# Patient Record
Sex: Female | Born: 1995 | Race: White | Hispanic: No | Marital: Single | Smoking: Current every day smoker
Health system: Southern US, Community
[De-identification: ages and names within clinical notes are randomized; demographics above are authoritative.]

## PROBLEM LIST (undated history)

## (undated) DIAGNOSIS — G43909 Migraine, unspecified, not intractable, without status migrainosus: Secondary | ICD-10-CM

## (undated) DIAGNOSIS — F32A Depression, unspecified: Secondary | ICD-10-CM

## (undated) DIAGNOSIS — F419 Anxiety disorder, unspecified: Secondary | ICD-10-CM

## (undated) DIAGNOSIS — F319 Bipolar disorder, unspecified: Secondary | ICD-10-CM

## (undated) HISTORY — PX: WRIST SURGERY: SHX841

## (undated) HISTORY — PX: WISDOM TOOTH EXTRACTION: SHX21

---
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
## 2015-03-29 ENCOUNTER — Encounter (HOSPITAL_COMMUNITY): Payer: Self-pay | Admitting: Emergency Medicine

## 2015-03-29 ENCOUNTER — Emergency Department (HOSPITAL_COMMUNITY)
Admission: EM | Admit: 2015-03-29 | Discharge: 2015-03-30 | Disposition: A | Discharge status: Home / Self Care | Payer: 59 | Attending: Emergency Medicine | Admitting: Emergency Medicine

## 2015-03-29 DIAGNOSIS — Z720 Tobacco use: Secondary | ICD-10-CM | POA: Diagnosis not present

## 2015-03-29 DIAGNOSIS — Z3202 Encounter for pregnancy test, result negative: Secondary | ICD-10-CM | POA: Diagnosis not present

## 2015-03-29 DIAGNOSIS — R5383 Other fatigue: Secondary | ICD-10-CM | POA: Diagnosis not present

## 2015-03-29 DIAGNOSIS — F419 Anxiety disorder, unspecified: Secondary | ICD-10-CM | POA: Diagnosis not present

## 2015-03-29 DIAGNOSIS — E876 Hypokalemia: Secondary | ICD-10-CM | POA: Diagnosis not present

## 2015-03-29 DIAGNOSIS — R0602 Shortness of breath: Secondary | ICD-10-CM | POA: Diagnosis not present

## 2015-03-29 DIAGNOSIS — R002 Palpitations: Secondary | ICD-10-CM | POA: Diagnosis not present

## 2015-03-29 DIAGNOSIS — R531 Weakness: Secondary | ICD-10-CM | POA: Diagnosis not present

## 2015-03-29 DIAGNOSIS — M7981 Nontraumatic hematoma of soft tissue: Secondary | ICD-10-CM | POA: Diagnosis not present

## 2015-03-29 DIAGNOSIS — R42 Dizziness and giddiness: Secondary | ICD-10-CM | POA: Diagnosis present

## 2015-03-29 DIAGNOSIS — R062 Wheezing: Secondary | ICD-10-CM | POA: Insufficient documentation

## 2015-03-29 DIAGNOSIS — R05 Cough: Secondary | ICD-10-CM | POA: Diagnosis not present

## 2015-03-29 DIAGNOSIS — R000 Tachycardia, unspecified: Secondary | ICD-10-CM | POA: Insufficient documentation

## 2015-03-29 DIAGNOSIS — R251 Tremor, unspecified: Secondary | ICD-10-CM | POA: Insufficient documentation

## 2015-03-29 DIAGNOSIS — R059 Cough, unspecified: Secondary | ICD-10-CM

## 2015-03-29 LAB — POC URINE PREG, ED: PREG TEST UR: NEGATIVE

## 2015-03-29 MED ORDER — ALBUTEROL (5 MG/ML) CONTINUOUS INHALATION SOLN
10.0000 mg | INHALATION_SOLUTION | RESPIRATORY_TRACT | Status: DC
  Filled 2015-03-29: qty 20

## 2015-03-29 MED ORDER — IPRATROPIUM BROMIDE 0.02 % IN SOLN
0.5000 mg | Freq: Once | RESPIRATORY_TRACT | Status: AC
Start: 2015-03-30 — End: 2015-03-30
  Administered 2015-03-30: 0.5 mg via RESPIRATORY_TRACT
  Filled 2015-03-29: qty 2.5

## 2015-03-29 MED ORDER — LORAZEPAM 0.5 MG PO TABS
1.0000 mg | ORAL_TABLET | Freq: Once | ORAL | Status: DC
  Filled 2015-03-29: qty 2

## 2015-03-29 MED ORDER — PREDNISONE 20 MG PO TABS
40.0000 mg | ORAL_TABLET | Freq: Once | ORAL | Status: AC
  Administered 2015-03-30: 40 mg via ORAL
  Filled 2015-03-29: qty 2

## 2015-03-29 NOTE — ED Notes (Signed)
Pt. reports panic/anxiety attack  with hands shaking , palpitations and mild lightheaded onset today . Respirations unlabored / denies pain .

## 2015-03-29 NOTE — ED Provider Notes (Signed)
CSN: [id number]     Arrival date & time 03/29/15  2201 History  This chart was scribed for Amy Berry, PA-C, working with Eber Hong, MD by Chestine Spore, ED Scribe. The patient was seen in room TR11C/TR11C at 11:45 PM.    Chief Complaint  Patient presents with  . Panic Attack    The history is provided by the patient. No language interpreter was used.    HPI Comments: Amy Boyd is a 19 y.o. female who presents to the Emergency Department complaining of panic attack onset today. Pt notes that yesterday she went to [employer] with her job and she stopped riding the rides due to nausea and fatigue. Pt has been fatigued since yesterday and when she woke up she was lightheaded, "shakey", and worked up. Pt notes that she began to feel flustered and that is why she came into the ED. Pt notes that while working tonight she felt as if she would pass out due to the symptoms. Pt notes that she has had symptoms like this in the past but not as severe as today. She states that she is having associated symptoms of intermittent lightheadedness, SOB, chest tightness, palpitations, dry cough x 3 weeks, wheezing, weakness, and scattered brusing. Pt denies PMHx of cardiac issues. She denies appetite change, AMS, LOC. She also denies rhinorrhea, sore throat, nasal congestion, chest pain, lower extremity edema, chest tightness, abdominal pain, change in bowels, diarrhea, constipation, vomiting. She states that she is well-hydrated, yesterday she drank several bottles of water and even drinks Gatorade to try and replace her electrolytes. She denies any vaginal or urinary symptoms. She denies any numbness or tingling.  Pt reports that she is a cigarette smoker. Pt PCP is at [facility]. Pt denies having heavy menstural cycles  History reviewed. No pertinent past medical history. History reviewed. No pertinent past surgical history. No family history on file. Social History  Substance Use Topics  .  Smoking status: Current Some Day Smoker  . Smokeless tobacco: None  . Alcohol Use: Yes   OB History    No data available     Review of Systems  Constitutional: Positive for fatigue. Negative for fever, chills, diaphoresis, appetite change and unexpected weight change.  HENT: Negative.   Respiratory: Positive for cough, shortness of breath and wheezing.   Cardiovascular: Positive for palpitations.  Musculoskeletal: Negative.   Skin: Positive for color change. Negative for rash and wound.       Scattered bruising  Neurological: Positive for tremors, weakness and light-headedness. Negative for dizziness, seizures, syncope, facial asymmetry, speech difficulty, numbness and headaches.  Psychiatric/Behavioral: The patient is nervous/anxious.       Allergies  Review of patient's allergies indicates no known allergies.  Home Medications   Prior to Admission medications   Medication Sig Start Date End Date Taking? Authorizing Provider  albuterol (PROVENTIL HFA;VENTOLIN HFA) 108 (90 BASE) MCG/ACT inhaler Inhale 1-2 puffs into the lungs every 6 (six) hours as needed for wheezing or shortness of breath. 03/30/15   Amy Berry, PA-C  azithromycin (ZITHROMAX) 250 MG tablet Take 1 tablet (250 mg total) by mouth daily. Take first 2 tablets together, then 1 every day until finished. 03/30/15   Amy Berry, PA-C  potassium chloride SA (K-DUR,KLOR-CON) 20 MEQ tablet Take 1 tablet (20 mEq total) by mouth daily. 03/30/15   Amy Berry, PA-C  predniSONE (DELTASONE) 20 MG tablet Take 2 tablets (40 mg total) by mouth daily. Take 40 mg by mouth daily for  3 days, then  by mouth daily for 3 days, then  daily for 3 days 03/30/15   Amy Berry, PA-C   BP 125/71 mmHg  Pulse 87  Temp(Src) 98 F (36.7 C) (Oral)  Resp 19  SpO2 100%  LMP 03/08/2015 Physical Exam  Constitutional: She is oriented to person, place, and time. She appears well-developed and well-nourished. No distress.  HENT:  Head:  Normocephalic and atraumatic.  Nose: Nose normal.  Mouth/Throat: Oropharynx is clear and moist. No oropharyngeal exudate.  Eyes: Conjunctivae and EOM are normal. Pupils are equal, round, and reactive to light. Right eye exhibits no discharge. Left eye exhibits no discharge. No scleral icterus.  Neck: Normal range of motion. No JVD present. No tracheal deviation present. No thyromegaly present.  Cardiovascular: Regular rhythm, normal heart sounds and intact distal pulses.  Exam reveals no gallop and no friction rub.   No murmur heard. Tachycardic, symmetrical pulses palpated radial and 2+, dorsal pedis 2+, normal cap refill  Pulmonary/Chest: Effort normal. No respiratory distress. She has no wheezes. She has no rales. She exhibits no tenderness.  Breath sounds diminished bilaterally at the base, with rhonchi at the right lower lobe, no wheeze or rales  Abdominal: Soft. Bowel sounds are normal. She exhibits no distension and no mass. There is no tenderness. There is no rebound and no guarding.  Musculoskeletal: Normal range of motion. She exhibits no edema or tenderness.  Lymphadenopathy:    She has no cervical adenopathy.  Neurological: She is alert and oriented to person, place, and time. She has normal reflexes. No cranial nerve deficit. She exhibits normal muscle tone. Coordination normal.  Skin: Skin is warm and dry. No rash noted. She is not diaphoretic. No erythema. No pallor.  Psychiatric: She has a normal mood and affect. Her behavior is normal. Judgment and thought content normal.  Nursing note and vitals reviewed.   ED Course  Procedures (including critical care time) DIAGNOSTIC STUDIES: Oxygen Saturation is 100% on RA, nl by my interpretation.    COORDINATION OF CARE: 11:53 PM Discussed treatment plan with pt at bedside and pt agreed to plan.   Labs Review Labs Reviewed  BASIC METABOLIC PANEL - Abnormal; Notable for the following:    Potassium 3.1 (*)    Glucose, Bld 121  (*)    All other components within normal limits  CBG MONITORING, ED - Abnormal; Notable for the following:    Glucose-Capillary 111 (*)    All other components within normal limits  CBC WITH DIFFERENTIAL/PLATELET  URINALYSIS, ROUTINE W REFLEX MICROSCOPIC (NOT AT [facility])  URINE RAPID DRUG SCREEN, HOSP PERFORMED  POC URINE PREG, ED  Rosezena Sensor, ED    Imaging Review Dg Chest 2 View  03/30/2015   CLINICAL DATA:  Cough for 3 weeks.  Shortness of breath.  EXAM: CHEST  2 VIEW  COMPARISON:  April 14, 2011  FINDINGS: Lungs are clear. Heart size and pulmonary vascularity are normal. No adenopathy. No bone lesions.  IMPRESSION: No abnormality noted.   Electronically Signed   By: Bretta Bang III M.D.   On: 03/30/2015 00:56     EKG Interpretation   Date/Time:  Thursday March 30 2015 00:43:00 EDT Ventricular Rate:  103 PR Interval:  164 QRS Duration: 78 QT Interval:  332 QTC Calculation: 434 R Axis:   57 Text Interpretation:  Sinus tachycardia Nonspecific T wave abnormality  Abnormal ECG No acute changes No old tracing to compare Confirmed by  Rhunette Croft, MD, ANKIT [phone]) on  03/30/2015 1:11:37 AM      MDM   Final diagnoses:  Cough  Weakness  Shortness of breath  Hypokalemia   patient has complaints of fatigue, weakness, lightheaded, tremors, palpitations and shortness of breath with a cough for 3 weeks, and then also feels like she was having a panic attack.    On exam patient does not appear clinically dehydrated, she did have some tachycardia, and lungs had some rhonchi in the right lower lobe.  Given her many vague symptoms, I felt a workup was indicated.  Patient in the room did not appear acutely anxious. She was articulate and able to answer questions appropriately.    Plan:  Orthostatics, CBC, BMP, urine, EKG, chest x-ray and troponin, IVF, duoneb - would like to r/o dehydration, hypoglycemia, electrolyte abnormality, illicit drug use, anemia and also r/o PNA  Labs  are pertinent for hypokalemia, EKG shows sinus tachycardia with non-specific T-wave abnormalities, may be secondary to hypokalemia CXR negative for PNA, upon re-exam, lungs CTA after duoneb, suspect bronchitis with length of cough and pt current smoker Urine and remaining labs unremarkable, troponin negative.  Pt did feel better after tx and fluids.  Pt was discharged home after potassium was replaced, with Z-Pak, prednisone taper and albuterol inhaler as needed if she feels the cough is not improving. She was given 2 more doses of potassium, and we had a discussion with her and with father present about following up with a PCP within one to 2 weeks to redraw chemistry to reevaluate for hypokalemia.  All results were reviewed with the patient, all questions answered. Patient vitals were reviewed showing resolution of her tachycardia.  I explained to patient that we do not prescribe benzos from the ER for acute anxiety/panic attacks, and she stated that she did not feel like she needed a prescription for it.  She was discharged home in satisfactory condition.  Medications  ipratropium (ATROVENT) nebulizer solution 0.5 mg (0.5 mg Nebulization Given 03/30/15 0027)  predniSONE (DELTASONE) tablet 40 mg (40 mg Oral Given 03/30/15 0100)  albuterol (PROVENTIL) (2.5 MG/3ML) 0.083% nebulizer solution 5 mg (5 mg Nebulization Given 03/30/15 0027)  potassium chloride SA (K-DUR,KLOR-CON) CR tablet 40 mEq (40 mEq Oral Given 03/30/15 0159)   Filed Vitals:   03/29/15 2207 03/30/15 0029 03/30/15 0030 03/30/15 0203  BP: 138/88   125/71  Pulse: 112   87  Temp: 97.8 F (36.6 C)   98 F (36.7 C)  TempSrc: Oral   Oral  Resp: 18   19  SpO2: 100% 100% 100% 100%      I personally performed the services described in this documentation, which was scribed in my presence. The recorded information has been reviewed and is accurate.    Amy Berry, PA-C 03/30/15 0327  Eber Hong, MD 03/30/15 [phone]

## 2015-03-30 ENCOUNTER — Emergency Department (HOSPITAL_COMMUNITY): Payer: 59

## 2015-03-30 LAB — BASIC METABOLIC PANEL
Anion gap: 11 (ref 5–15)
BUN: 7 mg/dL (ref 6–20)
CO2: 24 mmol/L (ref 22–32)
Calcium: 9.3 mg/dL (ref 8.9–10.3)
Chloride: 106 mmol/L (ref 101–111)
Creatinine, Ser: 0.84 mg/dL (ref 0.44–1.00)
GFR calc Af Amer: >60 mL/min (ref >60)
GFR calc non Af Amer: >60 mL/min (ref >60)
GLUCOSE: 121 mg/dL — AB (ref 65–99)
POTASSIUM: 3.1 mmol/L — AB (ref 3.5–5.1)
SODIUM: 141 mmol/L (ref 135–145)

## 2015-03-30 LAB — CBC WITH DIFFERENTIAL/PLATELET
Basophils Absolute: 0 10*3/uL (ref 0.0–0.1)
Basophils Relative: 0 % (ref 0–1)
EOS PCT: 0 % (ref 0–5)
Eosinophils Absolute: 0 10*3/uL (ref 0.0–0.7)
HCT: 39 % (ref 36.0–46.0)
Hemoglobin: 13.5 g/dL (ref 12.0–15.0)
LYMPHS ABS: 2.6 10*3/uL (ref 0.7–4.0)
LYMPHS PCT: 34 % (ref 12–46)
MCH: 31.3 pg (ref 26.0–34.0)
MCHC: 34.6 g/dL (ref 30.0–36.0)
MCV: 90.3 fL (ref 78.0–100.0)
MONOS PCT: 5 % (ref 3–12)
Monocytes Absolute: 0.4 10*3/uL (ref 0.1–1.0)
NEUTROS PCT: 61 % (ref 43–77)
Neutro Abs: 4.8 10*3/uL (ref 1.7–7.7)
PLATELETS: 251 10*3/uL (ref 150–400)
RBC: 4.32 MIL/uL (ref 3.87–5.11)
RDW: 11.9 % (ref 11.5–15.5)
WBC: 7.9 10*3/uL (ref 4.0–10.5)

## 2015-03-30 LAB — RAPID URINE DRUG SCREEN, HOSP PERFORMED
Amphetamines: NOT DETECTED
BARBITURATES: NOT DETECTED
BENZODIAZEPINES: NOT DETECTED
Cocaine: NOT DETECTED
Opiates: NOT DETECTED
Tetrahydrocannabinol: NOT DETECTED

## 2015-03-30 LAB — URINALYSIS, ROUTINE W REFLEX MICROSCOPIC
BILIRUBIN URINE: NEGATIVE
GLUCOSE, UA: NEGATIVE mg/dL
HGB URINE DIPSTICK: NEGATIVE
Ketones, ur: NEGATIVE mg/dL
Leukocytes, UA: NEGATIVE
Nitrite: NEGATIVE
PH: 7.5 (ref 5.0–8.0)
Protein, ur: NEGATIVE mg/dL
SPECIFIC GRAVITY, URINE: 1.006 (ref 1.005–1.030)
Urobilinogen, UA: 0.2 mg/dL (ref 0.0–1.0)

## 2015-03-30 LAB — CBG MONITORING, ED: Glucose-Capillary: 111 mg/dL — ABNORMAL HIGH (ref 65–99)

## 2015-03-30 LAB — I-STAT TROPONIN, ED: Troponin i, poc: 0.01 ng/mL (ref 0.00–0.08)

## 2015-03-30 MED ORDER — POTASSIUM CHLORIDE CRYS ER 20 MEQ PO TBCR
40.0000 meq | EXTENDED_RELEASE_TABLET | Freq: Once | ORAL | Status: AC
  Administered 2015-03-30: 40 meq via ORAL
  Filled 2015-03-30: qty 2

## 2015-03-30 MED ORDER — ALBUTEROL SULFATE HFA 108 (90 BASE) MCG/ACT IN AERS: 1.0000 | INHALATION_SPRAY | Freq: Four times a day (QID) | RESPIRATORY_TRACT | Status: DC | PRN

## 2015-03-30 MED ORDER — POTASSIUM CHLORIDE CRYS ER 20 MEQ PO TBCR: 20.0000 meq | EXTENDED_RELEASE_TABLET | Freq: Every day | ORAL | Status: DC

## 2015-03-30 MED ORDER — AZITHROMYCIN 250 MG PO TABS: 250.0000 mg | ORAL_TABLET | Freq: Every day | ORAL | Status: DC

## 2015-03-30 MED ORDER — PREDNISONE 20 MG PO TABS: 40.0000 mg | ORAL_TABLET | Freq: Every day | ORAL | Status: DC

## 2015-03-30 MED ORDER — ALBUTEROL SULFATE (2.5 MG/3ML) 0.083% IN NEBU
5.0000 mg | INHALATION_SOLUTION | Freq: Once | RESPIRATORY_TRACT | Status: AC
Start: 2015-03-30 — End: 2015-03-30
  Administered 2015-03-30: 5 mg via RESPIRATORY_TRACT
  Filled 2015-03-30: qty 6

## 2015-03-30 NOTE — ED Notes (Signed)
Lab called about UA and UDS 

## 2015-03-30 NOTE — ED Notes (Signed)
Patient transported to X-ray 

## 2015-03-30 NOTE — Discharge Instructions (Signed)
Cough, Adult  A cough is a reflex that helps clear your throat and airways. It can help heal the body or may be a reaction to an irritated airway. A cough may only last 2 or 3 weeks (acute) or may last more than 8 weeks (chronic).  CAUSES Acute cough:  Viral or bacterial infections. Chronic cough:  Infections.  Allergies.  Asthma.  Post-nasal drip.  Smoking.  Heartburn or acid reflux.  Some medicines.  Chronic lung problems (COPD).  Cancer. SYMPTOMS   Cough.  Fever.  Chest pain.  Increased breathing rate.  High-pitched whistling sound when breathing (wheezing).  Colored mucus that you cough up (sputum). TREATMENT   A bacterial cough may be treated with antibiotic medicine.  A viral cough must run its course and will not respond to antibiotics.  Your caregiver may recommend other treatments if you have a chronic cough. HOME CARE INSTRUCTIONS   Only take over-the-counter or prescription medicines for pain, discomfort, or fever as directed by your caregiver. Use cough suppressants only as directed by your caregiver.  Use a cold steam vaporizer or humidifier in your bedroom or home to help loosen secretions.  Sleep in a semi-upright position if your cough is worse at night.  Rest as needed.  Stop smoking if you smoke. SEEK IMMEDIATE MEDICAL CARE IF:   You have pus in your sputum.  Your cough starts to worsen.  You cannot control your cough with suppressants and are losing sleep.  You begin coughing up blood.  You have difficulty breathing.  You develop pain which is getting worse or is uncontrolled with medicine.  You have a fever. MAKE SURE YOU:   Understand these instructions.  Will watch your condition.  Will get help right away if you are not doing well or get worse. Document Released: 02/01/2011 Document Revised: 10/28/2011 Document Reviewed: 02/01/2011 [facility] Patient Information 2015 [place], [place]. This information is not intended  to replace advice given to you by your health care provider. Make sure you discuss any questions you have with your health care provider.  Weakness Weakness is a lack of strength. It may be felt all over the body (generalized) or in one specific part of the body (focal). Some causes of weakness can be serious. You may need further medical evaluation, especially if you are elderly or you have a history of immunosuppression (such as chemotherapy or HIV), kidney disease, heart disease, or diabetes. CAUSES  Weakness can be caused by many different things, including:  Infection.  Physical exhaustion.  Internal bleeding or other blood loss that results in a lack of red blood cells (anemia).  Dehydration. This cause is more common in elderly people.  Side effects or electrolyte abnormalities from medicines, such as pain medicines or sedatives.  Emotional distress, anxiety, or depression.  Circulation problems, especially severe peripheral arterial disease.  Heart disease, such as rapid atrial fibrillation, bradycardia, or heart failure.  Nervous system disorders, such as Guillain-Barr syndrome, multiple sclerosis, or stroke. DIAGNOSIS  To find the cause of your weakness, your caregiver will take your history and perform a physical exam. Lab tests or X-rays may also be ordered, if needed. TREATMENT  Treatment of weakness depends on the cause of your symptoms and can vary greatly. HOME CARE INSTRUCTIONS   Rest as needed.  Eat a well-balanced diet.  Try to get some exercise every day.  Only take over-the-counter or prescription medicines as directed by your caregiver. SEEK MEDICAL CARE IF:   Your weakness seems  to be getting worse or spreads to other parts of your body.  You develop new aches or pains. SEEK IMMEDIATE MEDICAL CARE IF:   You cannot perform your normal daily activities, such as getting dressed and feeding yourself.  You cannot walk up and down stairs, or you feel  exhausted when you do so.  You have shortness of breath or chest pain.  You have difficulty moving parts of your body.  You have weakness in only one area of the body or on only one side of the body.  You have a fever.  You have trouble speaking or swallowing.  You cannot control your bladder or bowel movements.  You have black or bloody vomit or stools. MAKE SURE YOU:  Understand these instructions.  Will watch your condition.  Will get help right away if you are not doing well or get worse. Document Released: 08/05/2005 Document Revised: 02/04/2012 Document Reviewed: 10/04/2011 [facility] Patient Information 2015 [place], [place]. This information is not intended to replace advice given to you by your health care provider. Make sure you discuss any questions you have with your health care provider.  Weakness Weakness is a lack of strength. You may feel weak all over your body or just in one part of your body. Weakness can be serious. In some cases, you may need more medical tests. HOME CARE  Rest.  Eat a well-balanced diet.  Try to exercise every day.  Only take medicines as told by your doctor. GET HELP RIGHT AWAY IF:   You cannot do your normal daily activities.  You cannot walk up and down stairs, or you feel very tired when you do so.  You have shortness of breath or chest pain.  You have trouble moving parts of your body.  You have weakness in only one body part or on only one side of the body.  You have a fever.  You have trouble speaking or swallowing.  You cannot control when you pee (urinate) or poop (bowel movement).  You have black or bloody throw up (vomit) or poop.  Your weakness gets worse or spreads to other body parts.  You have new aches or pains. MAKE SURE YOU:   Understand these instructions.  Will watch your condition.  Will get help right away if you are not doing well or get worse. Document Released: 07/18/2008 Document Revised:  02/04/2012 Document Reviewed: 10/04/2011 [facility] Patient Information 2015 [place], [place]. This information is not intended to replace advice given to you by your health care provider. Make sure you discuss any questions you have with your health care provider.

## 2015-03-30 NOTE — ED Notes (Signed)
Lab called about UA and UDS

## 2018-01-11 ENCOUNTER — Encounter (HOSPITAL_COMMUNITY): Payer: Self-pay | Admitting: Emergency Medicine

## 2018-01-11 ENCOUNTER — Other Ambulatory Visit: Payer: Self-pay

## 2018-01-11 ENCOUNTER — Emergency Department (HOSPITAL_COMMUNITY): Payer: 59

## 2018-01-11 ENCOUNTER — Observation Stay (HOSPITAL_COMMUNITY)
Admission: EM | Admit: 2018-01-11 | Discharge: 2018-01-12 | Disposition: A | Discharge status: Home / Self Care | Payer: 59 | Attending: Internal Medicine | Admitting: Internal Medicine

## 2018-01-11 DIAGNOSIS — Z79899 Other long term (current) drug therapy: Secondary | ICD-10-CM | POA: Insufficient documentation

## 2018-01-11 DIAGNOSIS — R4182 Altered mental status, unspecified: Secondary | ICD-10-CM | POA: Diagnosis not present

## 2018-01-11 DIAGNOSIS — Z8669 Personal history of other diseases of the nervous system and sense organs: Secondary | ICD-10-CM | POA: Diagnosis not present

## 2018-01-11 DIAGNOSIS — R4701 Aphasia: Secondary | ICD-10-CM | POA: Diagnosis present

## 2018-01-11 DIAGNOSIS — R51 Headache: Secondary | ICD-10-CM | POA: Diagnosis not present

## 2018-01-11 DIAGNOSIS — F17200 Nicotine dependence, unspecified, uncomplicated: Secondary | ICD-10-CM | POA: Insufficient documentation

## 2018-01-11 DIAGNOSIS — G43109 Migraine with aura, not intractable, without status migrainosus: Secondary | ICD-10-CM

## 2018-01-11 DIAGNOSIS — R479 Unspecified speech disturbances: Secondary | ICD-10-CM

## 2018-01-11 DIAGNOSIS — G43719 Chronic migraine without aura, intractable, without status migrainosus: Secondary | ICD-10-CM

## 2018-01-11 HISTORY — DX: Migraine, unspecified, not intractable, without status migrainosus: G43.909

## 2018-01-11 LAB — COMPREHENSIVE METABOLIC PANEL
ALT: 20 U/L (ref 14–54)
ANION GAP: 10 (ref 5–15)
AST: 22 U/L (ref 15–41)
Albumin: 4.9 g/dL (ref 3.5–5.0)
Alkaline Phosphatase: 76 U/L (ref 38–126)
BILIRUBIN TOTAL: 0.6 mg/dL (ref 0.3–1.2)
BUN: 17 mg/dL (ref 6–20)
CALCIUM: 9.7 mg/dL (ref 8.9–10.3)
CO2: 26 mmol/L (ref 22–32)
CREATININE: 0.84 mg/dL (ref 0.44–1.00)
Chloride: 105 mmol/L (ref 101–111)
GFR calc Af Amer: >60 mL/min (ref >60)
Glucose, Bld: 151 mg/dL — ABNORMAL HIGH (ref 65–99)
POTASSIUM: 3.8 mmol/L (ref 3.5–5.1)
Sodium: 141 mmol/L (ref 135–145)
TOTAL PROTEIN: 8 g/dL (ref 6.5–8.1)

## 2018-01-11 LAB — CBC
HCT: 39.5 % (ref 36.0–46.0)
Hemoglobin: 13.5 g/dL (ref 12.0–15.0)
MCH: 30.8 pg (ref 26.0–34.0)
MCHC: 34.2 g/dL (ref 30.0–36.0)
MCV: 90.2 fL (ref 78.0–100.0)
PLATELETS: 282 10*3/uL (ref 150–400)
RBC: 4.38 MIL/uL (ref 3.87–5.11)
RDW: 11.7 % (ref 11.5–15.5)
WBC: 7.8 10*3/uL (ref 4.0–10.5)

## 2018-01-11 LAB — PROTIME-INR
INR: 0.99
PROTHROMBIN TIME: 13 s (ref 11.4–15.2)

## 2018-01-11 LAB — I-STAT CHEM 8, ED
BUN: 15 mg/dL (ref 6–20)
Calcium, Ion: 1.26 mmol/L (ref 1.15–1.40)
Chloride: 103 mmol/L (ref 101–111)
Creatinine, Ser: 0.8 mg/dL (ref 0.44–1.00)
Glucose, Bld: 147 mg/dL — ABNORMAL HIGH (ref 65–99)
HEMATOCRIT: 40 % (ref 36.0–46.0)
HEMOGLOBIN: 13.6 g/dL (ref 12.0–15.0)
POTASSIUM: 3.8 mmol/L (ref 3.5–5.1)
Sodium: 141 mmol/L (ref 135–145)
TCO2: 27 mmol/L (ref 22–32)

## 2018-01-11 LAB — APTT: APTT: 31 s (ref 24–36)

## 2018-01-11 LAB — CBG MONITORING, ED: Glucose-Capillary: 145 mg/dL — ABNORMAL HIGH (ref 65–99)

## 2018-01-11 MED ORDER — METOCLOPRAMIDE HCL 5 MG/ML IJ SOLN
10.0000 mg | Freq: Once | INTRAMUSCULAR | Status: AC
  Administered 2018-01-11: 10 mg via INTRAVENOUS
  Filled 2018-01-11: qty 2

## 2018-01-11 MED ORDER — LORAZEPAM 2 MG/ML IJ SOLN
1.0000 mg | Freq: Once | INTRAMUSCULAR | Status: AC
  Administered 2018-01-11: 1 mg via INTRAVENOUS
  Filled 2018-01-11: qty 1

## 2018-01-11 MED ORDER — STROKE: EARLY STAGES OF RECOVERY BOOK
Freq: Once | Status: DC
  Filled 2018-01-11: qty 1

## 2018-01-11 MED ORDER — LORAZEPAM 2 MG/ML IJ SOLN
0.5000 mg | Freq: Once | INTRAMUSCULAR | Status: AC | PRN
  Administered 2018-01-12: 0.5 mg via INTRAVENOUS
  Filled 2018-01-11: qty 1

## 2018-01-11 MED ORDER — ACETAMINOPHEN 160 MG/5ML PO SOLN: 650.0000 mg | ORAL | Status: DC | PRN

## 2018-01-11 MED ORDER — ACETAMINOPHEN 325 MG PO TABS: 650.0000 mg | ORAL_TABLET | ORAL | Status: DC | PRN

## 2018-01-11 MED ORDER — KETOROLAC TROMETHAMINE 30 MG/ML IJ SOLN
30.0000 mg | Freq: Once | INTRAMUSCULAR | Status: AC
  Administered 2018-01-11: 30 mg via INTRAVENOUS
  Filled 2018-01-11: qty 1

## 2018-01-11 MED ORDER — ASPIRIN 81 MG PO CHEW
324.0000 mg | CHEWABLE_TABLET | Freq: Once | ORAL | Status: AC
  Administered 2018-01-11: 324 mg via ORAL
  Filled 2018-01-11: qty 4

## 2018-01-11 MED ORDER — IOPAMIDOL (ISOVUE-370) INJECTION 76%
INTRAVENOUS | Status: AC
  Filled 2018-01-11: qty 100

## 2018-01-11 MED ORDER — ACETAMINOPHEN 650 MG RE SUPP: 650.0000 mg | RECTAL | Status: DC | PRN

## 2018-01-11 MED ORDER — ENOXAPARIN SODIUM 40 MG/0.4ML ~~LOC~~ SOLN
40.0000 mg | Freq: Every day | SUBCUTANEOUS | Status: DC
  Filled 2018-01-11: qty 0.4

## 2018-01-11 MED ORDER — IOPAMIDOL (ISOVUE-370) INJECTION 76%
100.0000 mL | Freq: Once | INTRAVENOUS | Status: AC | PRN
  Administered 2018-01-11: 80 mL via INTRAVENOUS

## 2018-01-11 NOTE — H&P (Addendum)
History and Physical    Amy Boyd ZOX:[id number] DOB: April 12, 1996 DOA: 01/11/2018  Referring MD/NP/PA: Alona Bene, MD PCP: Deatra James, MD  Patient coming from: home  Chief Complaint: Could not get my words out   I have personally briefly reviewed patient's old medical records in [facility]   HPI: Amy Boyd is a 22 y.o. female with medical history significant of migraine headaches; who presents with complaints of inability to get her words out.  Symptoms started around 1330 this afternoon with her stuttering and not being able to say what she wanted to.  She was able to understand what was being said to her, but felt like she was in a foggy at the time.  Symptoms only lasted approximately 1 minute and then self resolved.  Associated symptoms of blurred vision and palpitations.  Symptoms reoccurred at around 1850 similarly lasting approximately 1 minute.  She reports having a history of frequent headaches 3-4 times a week and migraines every 1 to 2 weeks.  She utilizes Excedrin migraine for symptoms as needed.  Denies headache/migraine symptoms surrounding episodes, focal weakness, chest pain, drug or alcohol use, family history of clotting disorder/bleeding issue or thyroid issue.  She reports recently having her thyroid levels checked and were reportedly normal.  She does admit to vaping with Juul.  ED Course: Upon admission into the emergency department patient was noted to be afebrile, pulse 93-110, respirations 16-21, blood pressures 123/79, 155/117, and O2 saturation maintained on room air.  CT scan of the brain showed no acute abnormalities.  labs including CBC and CMP were unremarkable for any significant acute abnormalities. Telemetry neurology was consulted and recommended CT angiogram of the head and neck, MRI, and completion of stroke work-up with possible hypercoagulable studies.  [facility] called to admit.  Review of Systems  Constitutional: Negative for chills and fever.    HENT: Negative for ear discharge and ear pain.   Eyes: Positive for blurred vision. Negative for pain.  Respiratory: Negative for cough and shortness of breath.   Cardiovascular: Positive for palpitations. Negative for chest pain and leg swelling.  Gastrointestinal: Negative for abdominal pain, constipation, diarrhea, nausea and vomiting.  Genitourinary: Negative for dysuria and frequency.  Musculoskeletal: Negative for joint pain and myalgias.  Skin: Negative for itching and rash.  Neurological: Positive for speech change. Negative for weakness.  Endo/Heme/Allergies: Does not bruise/bleed easily.  Psychiatric/Behavioral: Negative for memory loss and suicidal ideas.    Past Medical History:  Diagnosis Date  . Migraine headache     Past Surgical History:  Procedure Laterality Date  . WISDOM TOOTH EXTRACTION    . WRIST SURGERY Left      reports that she has been smoking.  She has never used smokeless tobacco. She reports that she drinks alcohol. She reports that she does not use drugs.  No Known Allergies  No known family history of blood clotting disorder  Prior to Admission medications   Medication Sig Start Date End Date Taking? Authorizing Provider  albuterol (PROVENTIL HFA;VENTOLIN HFA) 108 (90 BASE) MCG/ACT inhaler Inhale 1-2 puffs into the lungs every 6 (six) hours as needed for wheezing or shortness of breath. 03/30/15   Danelle Berry, PA-C  azithromycin (ZITHROMAX) 250 MG tablet Take 1 tablet (250 mg total) by mouth daily. Take first 2 tablets together, then 1 every day until finished. 03/30/15   Danelle Berry, PA-C  LO LOESTRIN FE 1 MG-10 MCG / 10 MCG tablet Take 1 tablet by mouth daily. 11/19/17  [provider]  potassium chloride SA (K-DUR,KLOR-CON) 20 MEQ tablet Take 1 tablet (20 mEq total) by mouth daily. 03/30/15   Danelle Berry, PA-C  predniSONE (DELTASONE) 20 MG tablet Take 2 tablets (40 mg total) by mouth daily. Take 40 mg by mouth daily for 3 days, then   by mouth daily for 3 days, then  daily for 3 days 03/30/15   Danelle Berry, PA-C    Physical Exam:  Constitutional: Young female NAD, calm, comfortable Vitals:   01/11/18 1940 01/11/18 1942 01/11/18 2000 01/11/18 2030  BP: (!) 139/102  131/85 123/79  Pulse: (!) 104  (!) 103 93  Resp: 20  (!) 21 16  Temp: 98.2 F (36.8 C)     TempSrc: Oral     SpO2: 100%  99% 98%  Weight:  65.3 kg (144 lb)    Height:   (1.626 m)     Eyes: PERRL, lids and conjunctivae normal ENMT: Mucous membranes are moist. Posterior pharynx clear of any exudate or lesions.Normal dentition.  Neck: normal, supple, no masses, no thyromegaly Respiratory: clear to auscultation bilaterally, no wheezing, no crackles. Normal respiratory effort. No accessory muscle use.  Cardiovascular: Regular rate and rhythm, no murmurs / rubs / gallops. No extremity edema. 2+ pedal pulses. No carotid bruits.  Abdomen: no tenderness, no masses palpated. No hepatosplenomegaly. Bowel sounds positive.  Musculoskeletal: no clubbing / cyanosis. No joint deformity upper and lower extremities. Good ROM, no contractures. Normal muscle tone.  Skin: no rashes, lesions, ulcers. No induration Neurologic: CN 2-12 grossly intact. Sensation intact, DTR normal. Strength 5/5 in all 4.  Psychiatric: Normal judgment and insight. Alert and oriented x 3. Normal mood.     Labs on Admission: I have personally reviewed following labs and imaging studies  CBC: Recent Labs  Lab 01/11/18 1946 01/11/18 2017  WBC 7.8  --   HGB 13.5 13.6  HCT 39.5 40.0  MCV 90.2  --   PLT 282  --    Basic Metabolic Panel: Recent Labs  Lab 01/11/18 1946 01/11/18 2017  NA 141 141  K 3.8 3.8  CL 105 103  CO2 26  --   GLUCOSE 151* 147*  BUN 17 15  CREATININE 0.84 0.80  CALCIUM 9.7  --    GFR: Estimated Creatinine Clearance: 95.3 mL/min (by C-G formula based on SCr of 0.8 mg/dL). Liver Function Tests: Recent Labs  Lab 01/11/18 1946  AST 22  ALT 20    ALKPHOS 76  BILITOT 0.6  PROT 8.0  ALBUMIN 4.9   No results for input(s): LIPASE, AMYLASE in the last 168 hours. No results for input(s): AMMONIA in the last 168 hours. Coagulation Profile: No results for input(s): INR, PROTIME in the last 168 hours. Cardiac Enzymes: No results for input(s): CKTOTAL, CKMB, CKMBINDEX, TROPONINI in the last 168 hours. BNP (last 3 results) No results for input(s): PROBNP in the last 8760 hours. HbA1C: No results for input(s): HGBA1C in the last 72 hours. CBG: Recent Labs  Lab 01/11/18 1940  GLUCAP 145*   Lipid Profile: No results for input(s): CHOL, HDL, LDLCALC, TRIG, CHOLHDL, LDLDIRECT in the last 72 hours. Thyroid Function Tests: No results for input(s): TSH, T4TOTAL, FREET4, T3FREE, THYROIDAB in the last 72 hours. Anemia Panel: No results for input(s): VITAMINB12, FOLATE, FERRITIN, TIBC, IRON, RETICCTPCT in the last 72 hours. Urine analysis:    Component Value Date/Time   COLORURINE YELLOW 03/29/2015 2314   APPEARANCEUR CLEAR 03/29/2015 2314   LABSPEC 1.006 03/29/2015  2314   PHURINE 7.5 03/29/2015 2314   GLUCOSEU NEGATIVE 03/29/2015 2314   HGBUR NEGATIVE 03/29/2015 2314   BILIRUBINUR NEGATIVE 03/29/2015 2314   KETONESUR NEGATIVE 03/29/2015 2314   PROTEINUR NEGATIVE 03/29/2015 2314   UROBILINOGEN 0.2 03/29/2015 2314   NITRITE NEGATIVE 03/29/2015 2314   LEUKOCYTESUR NEGATIVE 03/29/2015 2314   Sepsis Labs: No results found for this or any previous visit (from the past 240 hour(s)).   Radiological Exams on Admission: Ct Head Wo Contrast  Result Date: 01/11/2018 CLINICAL DATA:  Waxing and waning altered mental status, with intermittent aphasia. EXAM: CT HEAD WITHOUT CONTRAST TECHNIQUE: Contiguous axial images were obtained from the base of the skull through the vertex without intravenous contrast. COMPARISON:  None. FINDINGS: Brain: No evidence of acute infarction, hemorrhage, hydrocephalus, extra-axial collection or mass lesion/mass  effect. Normal cerebral volume. No white matter disease. Vascular: No hyperdense vessel or unexpected calcification. Skull: Normal. Negative for fracture or focal lesion. Sinuses/Orbits: No acute finding. Other: None. IMPRESSION: Negative exam. Electronically Signed   By: Elsie Stain M.D.   On: 01/11/2018 20:21    EKG: Independently reviewed. Normal sinus rhythm at 91 bpm  Assessment/Plan Expressive aphasia: Acute.  Patient presents with difficulty expressing  words, but able to understand.  Initial CT scan of the brain negative for any acute abnormalities.  Telemetry neurology consulted recommending admission for work-up.  Risk factors include oral contraceptive pills and Juul vaping.  Question TIA/stroke vs. complex migraine vs.  - To a telemetry bed at [facility] - Admit to telemetry bed - Stroke order set initiated - Neuro checks  - Add on PT/INR and APTT - Follow-up alcohol level and UDS - Check  MRI/MRA head w/o contrast  - Speech to eval and treat - Check echocardiogram - Check Hemoglobin A1c and lipid panel in a.m. - ASA - May warrant warrant formal hypercoagulable studies. - Appreciate neurology consultative services, will follow-up for further recommends  Vaping: Patient reports vaping with Juul. - Counseled on the need of cessation  Oral contraceptive use - Held oral contraceptive pill  DVT prophylaxis: lovenox Code Status: Full Family Communication: Discussed plan of care with the patient and family present at bedside Disposition Plan: TBD  Consults called: Neurology  Admission status: observation  Clydie Braun MD [facility] Pager [phone]   If 7PM-7AM, please contact night-coverage [email or web address] Password [facility]  01/11/2018, 10:05 PM
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
## 2018-01-11 NOTE — ED Triage Notes (Signed)
Pt presented to the ED with complaints of waxing and waning AMS with intermittent aphasia.  Patient was driving at 1610 today when she began to stutter and "I couldn't get my words out." Patient states she was "foggy" and had to have her boyfriend pull over. Patient States the episode lasted a minute and then went away. At about 1850, patient states she was driving and she had another episode similar to previous which lasted 1 minute also. Patient hx of migraines. No hx of seizures or other neuro disorders.

## 2018-01-12 ENCOUNTER — Observation Stay (HOSPITAL_BASED_OUTPATIENT_CLINIC_OR_DEPARTMENT_OTHER): Payer: 59

## 2018-01-12 ENCOUNTER — Observation Stay (HOSPITAL_COMMUNITY): Payer: 59

## 2018-01-12 ENCOUNTER — Encounter (HOSPITAL_COMMUNITY): Payer: Self-pay | Admitting: Internal Medicine

## 2018-01-12 DIAGNOSIS — R4701 Aphasia: Secondary | ICD-10-CM | POA: Diagnosis not present

## 2018-01-12 DIAGNOSIS — G459 Transient cerebral ischemic attack, unspecified: Secondary | ICD-10-CM

## 2018-01-12 DIAGNOSIS — G43109 Migraine with aura, not intractable, without status migrainosus: Secondary | ICD-10-CM | POA: Diagnosis not present

## 2018-01-12 DIAGNOSIS — F17200 Nicotine dependence, unspecified, uncomplicated: Secondary | ICD-10-CM | POA: Diagnosis not present

## 2018-01-12 DIAGNOSIS — Z8669 Personal history of other diseases of the nervous system and sense organs: Secondary | ICD-10-CM | POA: Diagnosis not present

## 2018-01-12 DIAGNOSIS — R4182 Altered mental status, unspecified: Secondary | ICD-10-CM | POA: Diagnosis present

## 2018-01-12 DIAGNOSIS — R51 Headache: Secondary | ICD-10-CM | POA: Diagnosis not present

## 2018-01-12 DIAGNOSIS — Z79899 Other long term (current) drug therapy: Secondary | ICD-10-CM | POA: Diagnosis not present

## 2018-01-12 LAB — ECHOCARDIOGRAM COMPLETE
HEIGHTINCHES: 64 in
Weight: 2304 oz

## 2018-01-12 LAB — RAPID URINE DRUG SCREEN, HOSP PERFORMED
Amphetamines: NOT DETECTED
Barbiturates: NOT DETECTED
Benzodiazepines: POSITIVE — AB
Cocaine: POSITIVE — AB
Opiates: NOT DETECTED
Tetrahydrocannabinol: NOT DETECTED

## 2018-01-12 LAB — TSH: TSH: 1.849 u[IU]/mL (ref 0.350–4.500)

## 2018-01-12 MED ORDER — SUMATRIPTAN SUCCINATE 25 MG PO TABS: 25.0000 mg | ORAL_TABLET | ORAL | 0 refills | Status: DC | PRN

## 2018-01-12 MED ORDER — ENOXAPARIN SODIUM 40 MG/0.4ML ~~LOC~~ SOLN
40.0000 mg | Freq: Every day | SUBCUTANEOUS | Status: DC
  Administered 2018-01-12: 40 mg via SUBCUTANEOUS
  Filled 2018-01-12: qty 0.4

## 2018-01-12 MED ORDER — SUMATRIPTAN SUCCINATE 25 MG PO TABS
25.0000 mg | ORAL_TABLET | ORAL | Status: DC | PRN
  Filled 2018-01-12: qty 1

## 2018-01-12 MED ORDER — GADOBENATE DIMEGLUMINE 529 MG/ML IV SOLN
15.0000 mL | Freq: Once | INTRAVENOUS | Status: AC
  Administered 2018-01-12: 13 mL via INTRAVENOUS

## 2018-01-12 NOTE — Progress Notes (Signed)
Pt VSS; telemetry applied and verified with CCMD; NT called to second verify. Dionne Bucy RN

## 2018-01-12 NOTE — Progress Notes (Signed)
Patient and family educated on discharge summary. Able to verbalized understanding and teach back. IV and telemetry removed. No new concerns. Discharge summary provide.  Discharged from unit per staff in wheelchair. Family to transport home

## 2018-01-12 NOTE — Progress Notes (Signed)
Pt arrived to the unit via [facility]; pt A&O x4; MAE x4; denies any pain, pt oriented to the unit and room; fall safety precaution and prevention education completed. Pt skin clean, dry and intact with no pressure ulcer or opened wound noted. Pt sleeping comfortably in bed with call light within reach and bed alarm on. Boyfriend remains at bedside. Dionne Bucy RN

## 2018-01-12 NOTE — Progress Notes (Signed)
VASCULAR LAB   Patient had normal MRA of neck.  Please advise if Carotid Duplex still needed.    Thank you,  Lateka Rady, RVT 01/12/2018, 1:43 PM

## 2018-01-12 NOTE — Evaluation (Signed)
SLP Cancellation Note  Patient Details Name: Amy Boyd MRN: [id number] DOB: 07-14-1996   Cancelled treatment:       Reason Eval/Treat Not Completed: Other (comment)(pt reports symptoms have resolved, CT negative, MRI pending - will conduct eval if MRI + for acute change - pt/mother agree)   Chales Abrahams 01/12/2018, 9:34 AM   Donavan Burnet, MS [facility] SLP [phone]

## 2018-01-12 NOTE — Progress Notes (Signed)
Bedside EEG completed, results pending. 

## 2018-01-12 NOTE — Discharge Instructions (Signed)
Migraine Headache A migraine headache is an intense, throbbing pain on one side or both sides of the head. Migraines may also cause other symptoms, such as nausea, vomiting, and sensitivity to light and noise. What are the causes? Doing or taking certain things may also trigger migraines, such as:  Alcohol.  Smoking.  Medicines, such as: ? Medicine used to treat chest pain (nitroglycerine). ? Birth control pills. ? Estrogen pills. ? Certain blood pressure medicines.  Aged cheeses, chocolate, or caffeine.  Foods or drinks that contain nitrates, glutamate, aspartame, or tyramine.  Physical activity.  Other things that may trigger a migraine include:  Menstruation.  Pregnancy.  Hunger.  Stress, lack of sleep, too much sleep, or fatigue.  Weather changes.  What increases the risk? The following factors may make you more likely to experience migraine headaches:  Age. Risk increases with age.  Family history of migraine headaches.  Being Caucasian.  Depression and anxiety.  Obesity.  Being a woman.  Having a hole in the heart (patent foramen ovale) or other heart problems.  What are the signs or symptoms? The main symptom of this condition is pulsating or throbbing pain. Pain may:  Happen in any area of the head, such as on one side or both sides.  Interfere with daily activities.  Get worse with physical activity.  Get worse with exposure to bright lights or loud noises.  Other symptoms may include:  Nausea.  Vomiting.  Dizziness.  General sensitivity to bright lights, loud noises, or smells.  Before you get a migraine, you may get warning signs that a migraine is developing (aura). An aura may include:  Seeing flashing lights or having blind spots.  Seeing bright spots, halos, or zigzag lines.  Having tunnel vision or blurred vision.  Having numbness or a tingling feeling.  Having trouble talking.  Having muscle weakness.  How is this  diagnosed? A migraine headache can be diagnosed based on:  Your symptoms.  A physical exam.  Tests, such as CT scan or MRI of the head. These imaging tests can help rule out other causes of headaches.  Taking fluid from the spine (lumbar puncture) and analyzing it (cerebrospinal fluid analysis, or CSF analysis).  How is this treated? A migraine headache is usually treated with medicines that:  Relieve pain.  Relieve nausea.  Prevent migraines from coming back.  Treatment may also include:  Acupuncture.  Lifestyle changes like avoiding foods that trigger migraines.  Follow these instructions at home: Medicines  Take over-the-counter and prescription medicines only as told by your health care provider.  Do not drive or use heavy machinery while taking prescription pain medicine.  To prevent or treat constipation while you are taking prescription pain medicine, your health care provider may recommend that you: ? Drink enough fluid to keep your urine clear or pale yellow. ? Take over-the-counter or prescription medicines. ? Eat foods that are high in fiber, such as fresh fruits and vegetables, whole grains, and beans. ? Limit foods that are high in fat and processed sugars, such as fried and sweet foods. Lifestyle  Avoid alcohol use.  Do not use any products that contain nicotine or tobacco, such as cigarettes and e-cigarettes. If you need help quitting, ask your health care provider.  Get at least 8 hours of sleep every night.  Limit your stress. General instructions   Keep a journal to find out what may trigger your migraine headaches. For example, write down: ? What you eat and  drink. ? How much sleep you get. ? Any change to your diet or medicines.  If you have a migraine: ? Avoid things that make your symptoms worse, such as bright lights. ? It may help to lie down in a dark, quiet room. ? Do not drive or use heavy machinery. ? Ask your health care provider  what activities are safe for you while you are experiencing symptoms.  Keep all follow-up visits as told by your health care provider. This is important. Contact a health care provider if:  You develop symptoms that are different or more severe than your usual migraine symptoms. Get help right away if:  Your migraine becomes severe.  You have a fever.  You have a stiff neck.  You have vision loss.  Your muscles feel weak or like you cannot control them.  You start to lose your balance often.  You develop trouble walking.  You faint. This information is not intended to replace advice given to you by your health care provider. Make sure you discuss any questions you have with your health care provider. Document Released: 08/05/2005 Document Revised: 02/23/2016 Document Reviewed: 01/22/2016 Elsevier Interactive Patient Education  2017 [employer]   Aphasia Aphasia is damage to the part of your brain that you need to communicate. For most people, that area is on the left side of the brain. Aphasia does not affect your intelligence, but you may struggle to talk, understand speech, read, or write. Aphasia can happen to anyone at any age, but it is most common in older age. What are the causes? An interruption of blood supply to the brain (stroke) is the most common cause of aphasia. Any disease or disorder that damages the communication areas of the brain can cause aphasia. This includes:  Brain tumors.  Brain injuries.  Brain infections.  Progressive diseases of the nervous system (neurological disorders).  What increases the risk? You may be at risk for aphasia if you have had any trauma, disease, or disorder that damaged the communication areas of the brain. What are the signs or symptoms? Aphasia may start suddenly if it is caused by a stroke or brain injury. Aphasia caused by a tumor or a progressive neurological disorder may start gradually. The condition affects people  differently. Signs and symptoms of aphasia include:  Trouble finding the right word.  Using the wrong words.  Talking in sentences that do not make sense.  Making up words.  Being unable to understand other people's speech.  Having problems writing, spelling, or reading.  Having trouble with numbers.  Having trouble swallowing.  How is this diagnosed? Your health care provider may suspect you have aphasia if you lose the ability to speak or understand language. You may need to see a specialist (speech and language pathologist) to help determine the diagnosis of aphasia. This person may do a series of tests to check your ability to:  Speak.  Express ideas.  Make conversation.  Understand speech.  Read and write.  How is this treated? In some cases, aphasia may improve on its own over time. Treatment for aphasia usually involves therapy with a pathologist. Your treatment will be designed to meet your needs and abilities. Common treatments include:  Speech therapy.  Learning other ways to communicate.  Working with family members to find the best ways to communicate.  Working with an occupational therapist to find ways to communicate at work.  Follow these instructions at home:  Keep all follow-up appointments.  Make sure you have a good support system at home.  The following techniques may be helpful while communicating: ? Use short, simple sentences. Ask family members to do the same. Sentences that require one-word answers are easiest. ? Avoid distractions like background noise when trying to listen or talk. ? Try communicating with gestures, pointing, or drawing. ? Talk slowly. Ask family members to talk to you slowly. ? Maintain eye contact when communicating. Contact a health care provider if:  Your symptoms change or get worse.  You need more support at home.  You are struggling with anxiety or depression.  You develop trouble swallowing. This  information is not intended to replace advice given to you by your health care provider. Make sure you discuss any questions you have with your health care provider. Document Released: 04/27/2002 Document Revised: 02/23/2016 Document Reviewed: 10/25/2013 Elsevier Interactive Patient Education  2018 [employer].

## 2018-01-12 NOTE — ED Notes (Signed)
[facility] called for transport. 

## 2018-01-12 NOTE — Care Management Note (Signed)
Case Management Note  Patient Details  Name: [name] MRN: [id number] Date of Birth: 11-Nov-1995  Subjective/Objective:                    Action/Plan: Pt discharging home with self care. Pt has PCP, insurance and transportation home.   Expected Discharge Date:  01/12/18               Expected Discharge Plan:  Home/Self Care  In-House Referral:     Discharge planning Services     Post Acute Care Choice:    Choice offered to:     DME Arranged:    DME Agency:     HH Arranged:    HH Agency:     Status of Service:  Completed, signed off  If discussed at [employer] of Stay Meetings, dates discussed:    Additional Comments:  Kermit Balo, RN 01/12/2018, 4:00 PM

## 2018-01-12 NOTE — ED Notes (Signed)
Report given to [facility] truck

## 2018-01-12 NOTE — Evaluation (Signed)
SLP Cancellation Note  Patient Details Name: Amy Boyd MRN: [id number] DOB: 1996-02-24   Cancelled treatment:       Reason Eval/Treat Not Completed: Other (comment)(brain MRI negative, speech screened)   Chales Abrahams 01/12/2018, 2:57 PM   Donavan Burnet, MS [facility] SLP [phone]

## 2018-01-12 NOTE — Discharge Summary (Signed)
Physician Discharge Summary  Amy Boyd ZOX:[id number] DOB: 15-Nov-1995 DOA: 01/11/2018  PCP: Deatra James, MD  Admit date: 01/11/2018 Discharge date: 01/13/2018  Admitted From: home Discharge disposition: home   Recommendations for Outpatient Follow-Up:   Headache diary Neurology follow up  Discharge Diagnosis:   Active Problems:   Aphasia   Complicated migraine    Discharge Condition: Improved.  Diet recommendation: regular  Wound care: None.  Code status: Full.   History of Present Illness:   Amy Boyd is a 22 y.o. female with medical history significant of migraine headaches; who presents with complaints of inability to get her words out.  Symptoms started around 1330 this afternoon with her stuttering and not being able to say what she wanted to.  She was able to understand what was being said to her, but felt like she was in a foggy at the time.  Symptoms only lasted approximately 1 minute and then self resolved.  Associated symptoms of blurred vision and palpitations.  Symptoms reoccurred at around 1850 similarly lasting approximately 1 minute.  She reports having a history of frequent headaches 3-4 times a week and migraines every 1 to 2 weeks.  She utilizes Excedrin migraine for symptoms as needed.  Denies headache/migraine symptoms surrounding episodes, focal weakness, chest pain, drug or alcohol use, family history of clotting disorder/bleeding issue or thyroid issue.  She reports recently having her thyroid levels checked and were reportedly normal.  She does admit to vaping with Juul.     Hospital Course by Problem:   Complicated migraine -long standing history of migraines since age 23, -daily headaches -uses OTC Excedrin for relief. -on birth control. Her mom is at bedside who also has same type of migraines and tells me the pts sister also suffers from cyclic migraines.  -She uses no rescue meds for migraines.  -The pt presented to the ER on  5/26 with a migraine, AMS and sudden onset aphasia. This has now resolved completely and she is back to baseline.   -work up: CTA/CTC neg. MRI, MRA neg. EEG neg. Echo neg.  Recommendations per neuro: Imitrex 25mg  PO PRN migraine, may repeat x1 in 2h if no relief  keep a h/a diary to follow the cycles  recommend GYN f/u to discuss other [facility] options Out pt follow up with Dr Lucia Gaskins      Medical Consultants:   neuro    Discharge Exam:   Vitals:   01/12/18 1028 01/12/18 1228  BP: 114/67 114/75  Pulse: 83 83  Resp: 16 17  Temp: 98.1 F (36.7 C)   SpO2: 99% 98%   Vitals:   01/12/18 0733 01/12/18 0930 01/12/18 1028 01/12/18 1228  BP: 100/67 114/74 114/67 114/75  Pulse: 60 81 83 83  Resp: 20 14 16 17   Temp:  98.1 F (36.7 C) 98.1 F (36.7 C)   TempSrc:  Oral Oral   SpO2: 100% 100% 99% 98%  Weight:      Height:        General exam: Appears calm and comfortable- anxious to be d/c'd home  The results of significant diagnostics from this hospitalization (including imaging, microbiology, ancillary and laboratory) are listed below for reference.     Procedures and Diagnostic Studies:   Ct Angio Head W Or Wo Contrast  Result Date: 01/11/2018 CLINICAL DATA:  Altered mental status and intermittent aphasia. EXAM: CT ANGIOGRAPHY HEAD CT VENOGRAPHY HEAD TECHNIQUE: Multidetector CT imaging of the head was performed using the standard  protocol during bolus administration of intravenous contrast. Images were acquired in arterial and venous phases. Multiplanar CT image reconstructions and MIPs were obtained to evaluate the vascular anatomy. CONTRAST:  80mL ISOVUE-370 IOPAMIDOL (ISOVUE-370) INJECTION 76% COMPARISON:  Head CT 01/11/2018 FINDINGS: CTA HEAD FINDINGS ANTERIOR CIRCULATION: --Intracranial internal carotid arteries: Normal. --Anterior cerebral arteries: Normal. Both A1 segments are present. Patent anterior communicating artery. --Middle cerebral arteries: Normal. --Posterior  communicating arteries: Present on the left, absent on the right. POSTERIOR CIRCULATION: --Basilar artery: Normal. --Posterior cerebral arteries: Normal. --Superior cerebellar arteries: Normal. --Inferior cerebellar arteries: Normal anterior and posterior inferior cerebellar arteries. ANATOMIC VARIANTS: None DELAYED PHASE: No parenchymal contrast enhancement. Review of the MIP images confirms the above findings. CT VENOGRAM FINDINGS Superior sagittal sinus: Normal. Straight sinus: Normal. Inferior sagittal sinus, vein of Galen and internal cerebral veins: Normal. Transverse sinuses: Normal. Normal variant hypoplastic left transverse sinus. Sigmoid sinuses: Normal. Visualized jugular veins: Normal. IMPRESSION: Normal intracranial CTA and CTV. Electronically Signed   By: Deatra Robinson M.D.   On: 01/11/2018 22:45   Ct Head Wo Contrast  Result Date: 01/11/2018 CLINICAL DATA:  Waxing and waning altered mental status, with intermittent aphasia. EXAM: CT HEAD WITHOUT CONTRAST TECHNIQUE: Contiguous axial images were obtained from the base of the skull through the vertex without intravenous contrast. COMPARISON:  None. FINDINGS: Brain: No evidence of acute infarction, hemorrhage, hydrocephalus, extra-axial collection or mass lesion/mass effect. Normal cerebral volume. No white matter disease. Vascular: No hyperdense vessel or unexpected calcification. Skull: Normal. Negative for fracture or focal lesion. Sinuses/Orbits: No acute finding. Other: None. IMPRESSION: Negative exam. Electronically Signed   By: Elsie Stain M.D.   On: 01/11/2018 20:21   Mr [name] Neck W Wo Contrast  Result Date: 01/12/2018 CLINICAL DATA:  Abnormal speech. Personal history of migraine headaches with new onset expressive aphasia. EXAM: MRI HEAD WITHOUT CONTRAST MRA HEAD WITHOUT CONTRAST MRA NECK WITHOUT AND WITH CONTRAST TECHNIQUE: Multiplanar, multiecho pulse sequences of the brain and surrounding structures were obtained without intravenous  contrast. Angiographic images of the Circle of Willis were obtained using MRA technique without intravenous contrast. Angiographic images of the neck were obtained using MRA technique without and with intravenous contrast. Carotid stenosis measurements (when applicable) are obtained utilizing NASCET criteria, using the distal internal carotid diameter as the denominator. CONTRAST:  13mL MULTIHANCE GADOBENATE DIMEGLUMINE 529 MG/ML IV SOLN COMPARISON:  CT head without contrast and CTA head 01/11/2018 FINDINGS: MRI HEAD FINDINGS Brain: The diffusion-weighted images demonstrate no acute or subacute infarction. No acute hemorrhage or mass lesion is present. No significant white matter disease is present. The ventricles are of normal size. No significant extra-axial fluid collection is present. The internal auditory canals are within normal limits bilaterally. Brainstem and cerebellum are normal. Vascular: Flow is present in the major intracranial arteries. Skull and upper cervical spine: Skull base is normal. The craniocervical junction is normal. Sinuses/Orbits: The paranasal sinuses and mastoid air cells are clear. Globes and orbits are within normal limits. MRA HEAD FINDINGS MRA circle-of-Willis demonstrates no significant proximal stenosis, aneurysm, or branch vessel occlusion. Internal carotid arteries are within normal limits from the high cervical segments through the ICA termini. A1 and M1 segments are normal. The anterior communicating artery is patent. MCA bifurcations are intact. ACA and MCA branch vessels are normal. The vertebral arteries are codominant. PICA origins are visualized and normal. The left AICA is dominant. The basilar artery is normal. Both posterior cerebral arteries originate from basilar tip. PCA branch vessels are within normal  limits. MRA NECK FINDINGS Time-of-flight images demonstrate no significant flow disturbance at either carotid bifurcation. Flow is antegrade in the vertebral  arteries bilaterally. Postcontrast images demonstrate a 3 vessel arch configuration. The common carotid arteries and bifurcations are within normal limits bilaterally. The internal carotid arteries are normal through the skull base. The vertebral arteries originate from the subclavian arteries bilaterally without significant stenosis. There is no stenosis of either vertebral artery in the neck. IMPRESSION: 1. Normal MRI of the brain without contrast. 2. Negative MRA circle-of-Willis without significant proximal stenosis, aneurysm, or branch vessel occlusion. 3. Negative MRA neck without and with contrast. No focal stenosis. Flow is antegrade in the vertebral arteries bilaterally. These results were called by telephone at the time of interpretation on 01/12/2018 at 12:46 pm to Dr. Ritta Slot , who verbally acknowledged these results. Electronically Signed   By: Marin Roberts M.D.   On: 01/12/2018 12:46   Mr Brain Wo Contrast  Result Date: 01/12/2018 CLINICAL DATA:  Abnormal speech. Personal history of migraine headaches with new onset expressive aphasia. EXAM: MRI HEAD WITHOUT CONTRAST MRA HEAD WITHOUT CONTRAST MRA NECK WITHOUT AND WITH CONTRAST TECHNIQUE: Multiplanar, multiecho pulse sequences of the brain and surrounding structures were obtained without intravenous contrast. Angiographic images of the Circle of Willis were obtained using MRA technique without intravenous contrast. Angiographic images of the neck were obtained using MRA technique without and with intravenous contrast. Carotid stenosis measurements (when applicable) are obtained utilizing NASCET criteria, using the distal internal carotid diameter as the denominator. CONTRAST:  13mL MULTIHANCE GADOBENATE DIMEGLUMINE 529 MG/ML IV SOLN COMPARISON:  CT head without contrast and CTA head 01/11/2018 FINDINGS: MRI HEAD FINDINGS Brain: The diffusion-weighted images demonstrate no acute or subacute infarction. No acute hemorrhage or mass  lesion is present. No significant white matter disease is present. The ventricles are of normal size. No significant extra-axial fluid collection is present. The internal auditory canals are within normal limits bilaterally. Brainstem and cerebellum are normal. Vascular: Flow is present in the major intracranial arteries. Skull and upper cervical spine: Skull base is normal. The craniocervical junction is normal. Sinuses/Orbits: The paranasal sinuses and mastoid air cells are clear. Globes and orbits are within normal limits. MRA HEAD FINDINGS MRA circle-of-Willis demonstrates no significant proximal stenosis, aneurysm, or branch vessel occlusion. Internal carotid arteries are within normal limits from the high cervical segments through the ICA termini. A1 and M1 segments are normal. The anterior communicating artery is patent. MCA bifurcations are intact. ACA and MCA branch vessels are normal. The vertebral arteries are codominant. PICA origins are visualized and normal. The left AICA is dominant. The basilar artery is normal. Both posterior cerebral arteries originate from basilar tip. PCA branch vessels are within normal limits. MRA NECK FINDINGS Time-of-flight images demonstrate no significant flow disturbance at either carotid bifurcation. Flow is antegrade in the vertebral arteries bilaterally. Postcontrast images demonstrate a 3 vessel arch configuration. The common carotid arteries and bifurcations are within normal limits bilaterally. The internal carotid arteries are normal through the skull base. The vertebral arteries originate from the subclavian arteries bilaterally without significant stenosis. There is no stenosis of either vertebral artery in the neck. IMPRESSION: 1. Normal MRI of the brain without contrast. 2. Negative MRA circle-of-Willis without significant proximal stenosis, aneurysm, or branch vessel occlusion. 3. Negative MRA neck without and with contrast. No focal stenosis. Flow is antegrade  in the vertebral arteries bilaterally. These results were called by telephone at the time of interpretation on 01/12/2018 at 12:46  pm to Dr. Ritta Slot , who verbally acknowledged these results. Electronically Signed   By: Marin Roberts M.D.   On: 01/12/2018 12:46   Mr [name] Head Wo Contrast  Result Date: 01/12/2018 CLINICAL DATA:  Abnormal speech. Personal history of migraine headaches with new onset expressive aphasia. EXAM: MRI HEAD WITHOUT CONTRAST MRA HEAD WITHOUT CONTRAST MRA NECK WITHOUT AND WITH CONTRAST TECHNIQUE: Multiplanar, multiecho pulse sequences of the brain and surrounding structures were obtained without intravenous contrast. Angiographic images of the Circle of Willis were obtained using MRA technique without intravenous contrast. Angiographic images of the neck were obtained using MRA technique without and with intravenous contrast. Carotid stenosis measurements (when applicable) are obtained utilizing NASCET criteria, using the distal internal carotid diameter as the denominator. CONTRAST:  13mL MULTIHANCE GADOBENATE DIMEGLUMINE 529 MG/ML IV SOLN COMPARISON:  CT head without contrast and CTA head 01/11/2018 FINDINGS: MRI HEAD FINDINGS Brain: The diffusion-weighted images demonstrate no acute or subacute infarction. No acute hemorrhage or mass lesion is present. No significant white matter disease is present. The ventricles are of normal size. No significant extra-axial fluid collection is present. The internal auditory canals are within normal limits bilaterally. Brainstem and cerebellum are normal. Vascular: Flow is present in the major intracranial arteries. Skull and upper cervical spine: Skull base is normal. The craniocervical junction is normal. Sinuses/Orbits: The paranasal sinuses and mastoid air cells are clear. Globes and orbits are within normal limits. MRA HEAD FINDINGS MRA circle-of-Willis demonstrates no significant proximal stenosis, aneurysm, or branch vessel  occlusion. Internal carotid arteries are within normal limits from the high cervical segments through the ICA termini. A1 and M1 segments are normal. The anterior communicating artery is patent. MCA bifurcations are intact. ACA and MCA branch vessels are normal. The vertebral arteries are codominant. PICA origins are visualized and normal. The left AICA is dominant. The basilar artery is normal. Both posterior cerebral arteries originate from basilar tip. PCA branch vessels are within normal limits. MRA NECK FINDINGS Time-of-flight images demonstrate no significant flow disturbance at either carotid bifurcation. Flow is antegrade in the vertebral arteries bilaterally. Postcontrast images demonstrate a 3 vessel arch configuration. The common carotid arteries and bifurcations are within normal limits bilaterally. The internal carotid arteries are normal through the skull base. The vertebral arteries originate from the subclavian arteries bilaterally without significant stenosis. There is no stenosis of either vertebral artery in the neck. IMPRESSION: 1. Normal MRI of the brain without contrast. 2. Negative MRA circle-of-Willis without significant proximal stenosis, aneurysm, or branch vessel occlusion. 3. Negative MRA neck without and with contrast. No focal stenosis. Flow is antegrade in the vertebral arteries bilaterally. These results were called by telephone at the time of interpretation on 01/12/2018 at 12:46 pm to Dr. Ritta Slot , who verbally acknowledged these results. Electronically Signed   By: Marin Roberts M.D.   On: 01/12/2018 12:46   Ct Venogram Head  Result Date: 01/11/2018 CLINICAL DATA:  Altered mental status and intermittent aphasia. EXAM: CT ANGIOGRAPHY HEAD CT VENOGRAPHY HEAD TECHNIQUE: Multidetector CT imaging of the head was performed using the standard protocol during bolus administration of intravenous contrast. Images were acquired in arterial and venous phases. Multiplanar  CT image reconstructions and MIPs were obtained to evaluate the vascular anatomy. CONTRAST:  80mL ISOVUE-370 IOPAMIDOL (ISOVUE-370) INJECTION 76% COMPARISON:  Head CT 01/11/2018 FINDINGS: CTA HEAD FINDINGS ANTERIOR CIRCULATION: --Intracranial internal carotid arteries: Normal. --Anterior cerebral arteries: Normal. Both A1 segments are present. Patent anterior communicating artery. --Middle cerebral arteries: Normal. --  Posterior communicating arteries: Present on the left, absent on the right. POSTERIOR CIRCULATION: --Basilar artery: Normal. --Posterior cerebral arteries: Normal. --Superior cerebellar arteries: Normal. --Inferior cerebellar arteries: Normal anterior and posterior inferior cerebellar arteries. ANATOMIC VARIANTS: None DELAYED PHASE: No parenchymal contrast enhancement. Review of the MIP images confirms the above findings. CT VENOGRAM FINDINGS Superior sagittal sinus: Normal. Straight sinus: Normal. Inferior sagittal sinus, vein of Galen and internal cerebral veins: Normal. Transverse sinuses: Normal. Normal variant hypoplastic left transverse sinus. Sigmoid sinuses: Normal. Visualized jugular veins: Normal. IMPRESSION: Normal intracranial CTA and CTV. Electronically Signed   By: Deatra Robinson M.D.   On: 01/11/2018 22:45     Labs:   Basic Metabolic Panel: Recent Labs  Lab 01/11/18 1946 01/11/18 2017  NA 141 141  K 3.8 3.8  CL 105 103  CO2 26  --   GLUCOSE 151* 147*  BUN 17 15  CREATININE 0.84 0.80  CALCIUM 9.7  --    GFR Estimated Creatinine Clearance: 95.3 mL/min (by C-G formula based on SCr of 0.8 mg/dL). Liver Function Tests: Recent Labs  Lab 01/11/18 1946  AST 22  ALT 20  ALKPHOS 76  BILITOT 0.6  PROT 8.0  ALBUMIN 4.9   No results for input(s): LIPASE, AMYLASE in the last 168 hours. No results for input(s): AMMONIA in the last 168 hours. Coagulation profile Recent Labs  Lab 01/11/18 1930  INR 0.99    CBC: Recent Labs  Lab 01/11/18 1946 01/11/18 2017    WBC 7.8  --   HGB 13.5 13.6  HCT 39.5 40.0  MCV 90.2  --   PLT 282  --    Cardiac Enzymes: No results for input(s): CKTOTAL, CKMB, CKMBINDEX, TROPONINI in the last 168 hours. BNP: Invalid input(s): POCBNP CBG: Recent Labs  Lab 01/11/18 1940  GLUCAP 145*   D-Dimer No results for input(s): DDIMER in the last 72 hours. Hgb A1c No results for input(s): HGBA1C in the last 72 hours. Lipid Profile No results for input(s): CHOL, HDL, LDLCALC, TRIG, CHOLHDL, LDLDIRECT in the last 72 hours. Thyroid function studies Recent Labs    01/11/18 1930  TSH 1.849   Anemia work up No results for input(s): VITAMINB12, FOLATE, FERRITIN, TIBC, IRON, RETICCTPCT in the last 72 hours. Microbiology No results found for this or any previous visit (from the past 240 hour(s)).   Discharge Instructions:   Discharge Instructions    Ambulatory referral to Neurology   Complete by:  As directed    An appointment is requested in approximately: 1-2 months for migraines   Diet - low sodium heart healthy   Complete by:  As directed    Discharge instructions   Complete by:  As directed    Imitrex  PO PRN migraine, may repeat x1 in 2h if no relief  h/a diary to follow the cycles  recommend GYN f/u to discuss other [facility] options Out pt follow up with Dr Andi Hence made  Echo results normal   Increase activity slowly   Complete by:  As directed      Allergies as of 01/12/2018   No Known Allergies     Medication List    STOP taking these medications   albuterol 108 (90 Base) MCG/ACT inhaler Commonly known as:  PROVENTIL HFA;VENTOLIN HFA     TAKE these medications   LO LOESTRIN FE 1 MG-10 MCG / 10 MCG tablet Generic drug:  Norethindrone-Ethinyl Estradiol-Fe Biphas Take 1 tablet by mouth daily.   SUMAtriptan 25 MG tablet Commonly  known as:  IMITREX Take 1 tablet (25 mg total) by mouth every 2 (two) hours as needed for migraine or headache (May repeat x1 after 2 hr if no relief). May  repeat in 2 hours if headache persists or recurs.      Follow-up Information    Anson Fret, MD Follow up.   Specialty:  Neurology Why:  Office will call you to make appt for hospital f/u Contact information: [address] [place] [address] [phone]        Deatra James, MD Follow up in 1 week(s).   Specialty:  Family Medicine Contact information: [phone] [address] [place] [address] [phone]            Time coordinating discharge: 35 min  Signed:  Joseph Art  [facility] 01/13/2018, 3:46 PM

## 2018-01-12 NOTE — Consult Note (Addendum)
NEUROLOGY CONSULT  Reason for Consult:Migraines Referring Physician: Dr Katrinka Blazing  CC: headache  HPI: Amy Boyd is an 22 y.o. female with a long standing history of migraines since age 56, no other PMH and otherwise healthy and active. She reports daily headaches, that she uses OTC Excedrin for relief. She gets migraine type headaches every 3-4 weeks. She is on birth control. Her mom is at bedside who also has same type of migraines and tells me the pts sister also suffers from cyclic migraines. She uses no rescue meds for migraines. The pt presented to the ER on 5/26 with a migraine, AMS and sudden onset aphasia. This has now resolved completely and she is back to baseline.  She has had an extensive work up: CTA/CTC neg. MRI, MRA neg. EEG neg. Echo neg.   Past Medical History Past Medical History:  Diagnosis Date  . Migraine headache     Past Surgical History Past Surgical History:  Procedure Laterality Date  . WISDOM TOOTH EXTRACTION    . WRIST SURGERY Left     Family History Migraines in sister and mom  Social History    reports that she has been smoking.  She has never used smokeless tobacco. She reports that she drinks alcohol. She reports that she does not use drugs.  Allergies No Known Allergies  Home Medications Medications Prior to Admission  Medication Sig Dispense Refill  . LO LOESTRIN FE 1 MG-10 MCG / 10 MCG tablet Take 1 tablet by mouth daily.  0  . albuterol (PROVENTIL HFA;VENTOLIN HFA) 108 (90 BASE) MCG/ACT inhaler Inhale 1-2 puffs into the lungs every 6 (six) hours as needed for wheezing or shortness of breath. (Patient not taking: Reported on 01/11/2018) 1 Inhaler 0    Hospital Medications .  stroke: mapping our early stages of recovery book   Does not apply Once  . enoxaparin (LOVENOX) injection  40 mg Subcutaneous Daily     ROS: History obtained from pt  General ROS: negative for - chills, fatigue, fever, night sweats, weight gain or weight  loss Psychological ROS: negative for - behavioral disorder, hallucinations, memory difficulties, mood swings or suicidal ideation Ophthalmic ROS: negative for - blurry vision, double vision, eye pain or loss of vision ENT ROS: negative for - epistaxis, nasal discharge, oral lesions, sore throat, tinnitus or vertigo Allergy and Immunology ROS: negative for - hives or itchy/watery eyes Hematological and Lymphatic ROS: negative for - bleeding problems, bruising or swollen lymph nodes Endocrine ROS: negative for - galactorrhea, hair pattern changes, polydipsia/polyuria or temperature intolerance Respiratory ROS: negative for - cough, hemoptysis, shortness of breath or wheezing Cardiovascular ROS: negative for - chest pain, dyspnea on exertion, edema or irregular heartbeat Gastrointestinal ROS: negative for - abdominal pain, diarrhea, hematemesis, nausea/vomiting or stool incontinence Genito-Urinary ROS: negative for - dysuria, hematuria, incontinence or urinary frequency/urgency Musculoskeletal ROS: negative for - joint swelling or muscular weakness Neurological ROS: as noted in HPI Dermatological ROS: negative for rash and skin lesion changes   Physical Examination:  Vitals:   01/12/18 0733 01/12/18 0930 01/12/18 1028 01/12/18 1228  BP: 100/67 114/74 114/67 114/75  Pulse: 60 81 83 83  Resp: Temp:  98.1 F (36.7 C) 98.1 F (36.7 C)   TempSrc:  Oral Oral   SpO2: 100% 100% 99% 98%  Weight:      Height:        General - well nourished  Heart - Regular rate and rhythm - no murmer  Lungs - Clear to auscultation Abdomen - Soft - non tender Extremities - Distal pulses intact - no edema Skin - Warm and dry  Neurologic Examination:   Mental Status:  Alert, oriented, thought content appropriate. Speech without evidence of dysarthria or aphasia. Able to follow 3 step commands without difficulty.  Cranial Nerves:  II-bilateral visual fields intact III/IV/VI-Pupils were equal  and reacted. Extraocular movements were full.  V/VII-no facial numbness and no facial weakness.  VIII-hearing normal.  X-normal speech and symmetrical palatal movement.  XII-midline tongue extension  Motor: Right : Upper extremity   5/5    Left:     Upper extremity   5/5  Lower extremity   5/5     Lower extremity   5/5 Tone and bulk:normal tone throughout; no atrophy noted Sensory: Intact to light touch in all extremities. Deep Tendon Reflexes: 2/4 throughout Plantars: Downgoing bilaterally  Cerebellar: Normal finger to nose and heel to shin bilaterally. Gait: not tested   LABORATORY STUDIES:  Basic Metabolic Panel: Recent Labs  Lab 01/11/18 1946 01/11/18 2017  NA 141 141  K 3.8 3.8  CL 105 103  CO2 26  --   GLUCOSE 151* 147*  BUN 17 15  CREATININE 0.84 0.80  CALCIUM 9.7  --     Liver Function Tests: Recent Labs  Lab 01/11/18 1946  AST 22  ALT 20  ALKPHOS 76  BILITOT 0.6  PROT 8.0  ALBUMIN 4.9   No results for input(s): LIPASE, AMYLASE in the last 168 hours. No results for input(s): AMMONIA in the last 168 hours.  CBC: Recent Labs  Lab 01/11/18 1946 01/11/18 2017  WBC 7.8  --   HGB 13.5 13.6  HCT 39.5 40.0  MCV 90.2  --   PLT 282  --     CBG: Recent Labs  Lab 01/11/18 1940  GLUCAP 145*    Coagulation Studies: Recent Labs    01/11/18 1930  LABPROT 13.0  INR 0.99    Urinalysis: No results for input(s): COLORURINE, LABSPEC, PHURINE, GLUCOSEU, HGBUR, BILIRUBINUR, KETONESUR, PROTEINUR, UROBILINOGEN, NITRITE, LEUKOCYTESUR in the last 168 hours.  Invalid input(s): APPERANCEUR  Lipid Panel:  No results found for: CHOL, TRIG, HDL, CHOLHDL, VLDL, LDLCALC  HgbA1C:  No results found for: HGBA1C  Urine Drug Screen:      Component Value Date/Time   LABOPIA NONE DETECTED 03/29/2015 2314   COCAINSCRNUR NONE DETECTED 03/29/2015 2314   LABBENZ NONE DETECTED 03/29/2015 2314   AMPHETMU NONE DETECTED 03/29/2015 2314   THCU NONE DETECTED 03/29/2015  2314   LABBARB NONE DETECTED 03/29/2015 2314     Alcohol Level:  No results for input(s): ETH in the last 168 hours.  Miscellaneous labs:  EKG  EKG  IMAGING Reviewed as well as EEG reviewed is neg: Ct Angio Head W Or Wo Contrast  Result Date: 01/11/2018 CLINICAL DATA:  Altered mental status and intermittent aphasia. EXAM: CT ANGIOGRAPHY HEAD CT VENOGRAPHY HEAD TECHNIQUE: Multidetector CT imaging of the head was performed using the standard protocol during bolus administration of intravenous contrast. Images were acquired in arterial and venous phases. Multiplanar CT image reconstructions and MIPs were obtained to evaluate the vascular anatomy. CONTRAST:  80mL ISOVUE-370 IOPAMIDOL (ISOVUE-370) INJECTION 76% COMPARISON:  Head CT 01/11/2018 FINDINGS: CTA HEAD FINDINGS ANTERIOR CIRCULATION: --Intracranial internal carotid arteries: Normal. --Anterior cerebral arteries: Normal. Both A1 segments are present. Patent anterior communicating artery. --Middle cerebral arteries: Normal. --Posterior communicating arteries: Present on the left, absent on the right. POSTERIOR CIRCULATION: --Basilar  artery: Normal. --Posterior cerebral arteries: Normal. --Superior cerebellar arteries: Normal. --Inferior cerebellar arteries: Normal anterior and posterior inferior cerebellar arteries. ANATOMIC VARIANTS: None DELAYED PHASE: No parenchymal contrast enhancement. Review of the MIP images confirms the above findings. CT VENOGRAM FINDINGS Superior sagittal sinus: Normal. Straight sinus: Normal. Inferior sagittal sinus, vein of Galen and internal cerebral veins: Normal. Transverse sinuses: Normal. Normal variant hypoplastic left transverse sinus. Sigmoid sinuses: Normal. Visualized jugular veins: Normal. IMPRESSION: Normal intracranial CTA and CTV. Electronically Signed   By: Deatra Robinson M.D.   On: 01/11/2018 22:45   Ct Head Wo Contrast  Result Date: 01/11/2018 CLINICAL DATA:  Waxing and waning altered mental status,  with intermittent aphasia. EXAM: CT HEAD WITHOUT CONTRAST TECHNIQUE: Contiguous axial images were obtained from the base of the skull through the vertex without intravenous contrast. COMPARISON:  None. FINDINGS: Brain: No evidence of acute infarction, hemorrhage, hydrocephalus, extra-axial collection or mass lesion/mass effect. Normal cerebral volume. No white matter disease. Vascular: No hyperdense vessel or unexpected calcification. Skull: Normal. Negative for fracture or focal lesion. Sinuses/Orbits: No acute finding. Other: None. IMPRESSION: Negative exam. Electronically Signed   By: Elsie Stain M.D.   On: 01/11/2018 20:21   Mr [name] Neck W Wo Contrast  Result Date: 01/12/2018 CLINICAL DATA:  Abnormal speech. Personal history of migraine headaches with new onset expressive aphasia. EXAM: MRI HEAD WITHOUT CONTRAST MRA HEAD WITHOUT CONTRAST MRA NECK WITHOUT AND WITH CONTRAST TECHNIQUE: Multiplanar, multiecho pulse sequences of the brain and surrounding structures were obtained without intravenous contrast. Angiographic images of the Circle of Willis were obtained using MRA technique without intravenous contrast. Angiographic images of the neck were obtained using MRA technique without and with intravenous contrast. Carotid stenosis measurements (when applicable) are obtained utilizing NASCET criteria, using the distal internal carotid diameter as the denominator. CONTRAST:  13mL MULTIHANCE GADOBENATE DIMEGLUMINE 529 MG/ML IV SOLN COMPARISON:  CT head without contrast and CTA head 01/11/2018 FINDINGS: MRI HEAD FINDINGS Brain: The diffusion-weighted images demonstrate no acute or subacute infarction. No acute hemorrhage or mass lesion is present. No significant white matter disease is present. The ventricles are of normal size. No significant extra-axial fluid collection is present. The internal auditory canals are within normal limits bilaterally. Brainstem and cerebellum are normal. Vascular: Flow is present  in the major intracranial arteries. Skull and upper cervical spine: Skull base is normal. The craniocervical junction is normal. Sinuses/Orbits: The paranasal sinuses and mastoid air cells are clear. Globes and orbits are within normal limits. MRA HEAD FINDINGS MRA circle-of-Willis demonstrates no significant proximal stenosis, aneurysm, or branch vessel occlusion. Internal carotid arteries are within normal limits from the high cervical segments through the ICA termini. A1 and M1 segments are normal. The anterior communicating artery is patent. MCA bifurcations are intact. ACA and MCA branch vessels are normal. The vertebral arteries are codominant. PICA origins are visualized and normal. The left AICA is dominant. The basilar artery is normal. Both posterior cerebral arteries originate from basilar tip. PCA branch vessels are within normal limits. MRA NECK FINDINGS Time-of-flight images demonstrate no significant flow disturbance at either carotid bifurcation. Flow is antegrade in the vertebral arteries bilaterally. Postcontrast images demonstrate a 3 vessel arch configuration. The common carotid arteries and bifurcations are within normal limits bilaterally. The internal carotid arteries are normal through the skull base. The vertebral arteries originate from the subclavian arteries bilaterally without significant stenosis. There is no stenosis of either vertebral artery in the neck. IMPRESSION: 1. Normal MRI of the brain without  contrast. 2. Negative MRA circle-of-Willis without significant proximal stenosis, aneurysm, or branch vessel occlusion. 3. Negative MRA neck without and with contrast. No focal stenosis. Flow is antegrade in the vertebral arteries bilaterally. These results were called by telephone at the time of interpretation on 01/12/2018 at 12:46 pm to Dr. Ritta Slot , who verbally acknowledged these results. Electronically Signed   By: Marin Roberts M.D.   On: 01/12/2018 12:46   Mr  Brain Wo Contrast  Result Date: 01/12/2018 CLINICAL DATA:  Abnormal speech. Personal history of migraine headaches with new onset expressive aphasia. EXAM: MRI HEAD WITHOUT CONTRAST MRA HEAD WITHOUT CONTRAST MRA NECK WITHOUT AND WITH CONTRAST TECHNIQUE: Multiplanar, multiecho pulse sequences of the brain and surrounding structures were obtained without intravenous contrast. Angiographic images of the Circle of Willis were obtained using MRA technique without intravenous contrast. Angiographic images of the neck were obtained using MRA technique without and with intravenous contrast. Carotid stenosis measurements (when applicable) are obtained utilizing NASCET criteria, using the distal internal carotid diameter as the denominator. CONTRAST:  13mL MULTIHANCE GADOBENATE DIMEGLUMINE 529 MG/ML IV SOLN COMPARISON:  CT head without contrast and CTA head 01/11/2018 FINDINGS: MRI HEAD FINDINGS Brain: The diffusion-weighted images demonstrate no acute or subacute infarction. No acute hemorrhage or mass lesion is present. No significant white matter disease is present. The ventricles are of normal size. No significant extra-axial fluid collection is present. The internal auditory canals are within normal limits bilaterally. Brainstem and cerebellum are normal. Vascular: Flow is present in the major intracranial arteries. Skull and upper cervical spine: Skull base is normal. The craniocervical junction is normal. Sinuses/Orbits: The paranasal sinuses and mastoid air cells are clear. Globes and orbits are within normal limits. MRA HEAD FINDINGS MRA circle-of-Willis demonstrates no significant proximal stenosis, aneurysm, or branch vessel occlusion. Internal carotid arteries are within normal limits from the high cervical segments through the ICA termini. A1 and M1 segments are normal. The anterior communicating artery is patent. MCA bifurcations are intact. ACA and MCA branch vessels are normal. The vertebral arteries are  codominant. PICA origins are visualized and normal. The left AICA is dominant. The basilar artery is normal. Both posterior cerebral arteries originate from basilar tip. PCA branch vessels are within normal limits. MRA NECK FINDINGS Time-of-flight images demonstrate no significant flow disturbance at either carotid bifurcation. Flow is antegrade in the vertebral arteries bilaterally. Postcontrast images demonstrate a 3 vessel arch configuration. The common carotid arteries and bifurcations are within normal limits bilaterally. The internal carotid arteries are normal through the skull base. The vertebral arteries originate from the subclavian arteries bilaterally without significant stenosis. There is no stenosis of either vertebral artery in the neck. IMPRESSION: 1. Normal MRI of the brain without contrast. 2. Negative MRA circle-of-Willis without significant proximal stenosis, aneurysm, or branch vessel occlusion. 3. Negative MRA neck without and with contrast. No focal stenosis. Flow is antegrade in the vertebral arteries bilaterally. These results were called by telephone at the time of interpretation on 01/12/2018 at 12:46 pm to Dr. Ritta Slot , who verbally acknowledged these results. Electronically Signed   By: Marin Roberts M.D.   On: 01/12/2018 12:46   Mr [name] Head Wo Contrast  Result Date: 01/12/2018 CLINICAL DATA:  Abnormal speech. Personal history of migraine headaches with new onset expressive aphasia. EXAM: MRI HEAD WITHOUT CONTRAST MRA HEAD WITHOUT CONTRAST MRA NECK WITHOUT AND WITH CONTRAST TECHNIQUE: Multiplanar, multiecho pulse sequences of the brain and surrounding structures were obtained without intravenous contrast. Angiographic images  of the Circle of Willis were obtained using MRA technique without intravenous contrast. Angiographic images of the neck were obtained using MRA technique without and with intravenous contrast. Carotid stenosis measurements (when applicable) are  obtained utilizing NASCET criteria, using the distal internal carotid diameter as the denominator. CONTRAST:  13mL MULTIHANCE GADOBENATE DIMEGLUMINE 529 MG/ML IV SOLN COMPARISON:  CT head without contrast and CTA head 01/11/2018 FINDINGS: MRI HEAD FINDINGS Brain: The diffusion-weighted images demonstrate no acute or subacute infarction. No acute hemorrhage or mass lesion is present. No significant white matter disease is present. The ventricles are of normal size. No significant extra-axial fluid collection is present. The internal auditory canals are within normal limits bilaterally. Brainstem and cerebellum are normal. Vascular: Flow is present in the major intracranial arteries. Skull and upper cervical spine: Skull base is normal. The craniocervical junction is normal. Sinuses/Orbits: The paranasal sinuses and mastoid air cells are clear. Globes and orbits are within normal limits. MRA HEAD FINDINGS MRA circle-of-Willis demonstrates no significant proximal stenosis, aneurysm, or branch vessel occlusion. Internal carotid arteries are within normal limits from the high cervical segments through the ICA termini. A1 and M1 segments are normal. The anterior communicating artery is patent. MCA bifurcations are intact. ACA and MCA branch vessels are normal. The vertebral arteries are codominant. PICA origins are visualized and normal. The left AICA is dominant. The basilar artery is normal. Both posterior cerebral arteries originate from basilar tip. PCA branch vessels are within normal limits. MRA NECK FINDINGS Time-of-flight images demonstrate no significant flow disturbance at either carotid bifurcation. Flow is antegrade in the vertebral arteries bilaterally. Postcontrast images demonstrate a 3 vessel arch configuration. The common carotid arteries and bifurcations are within normal limits bilaterally. The internal carotid arteries are normal through the skull base. The vertebral arteries originate from the  subclavian arteries bilaterally without significant stenosis. There is no stenosis of either vertebral artery in the neck. IMPRESSION: 1. Normal MRI of the brain without contrast. 2. Negative MRA circle-of-Willis without significant proximal stenosis, aneurysm, or branch vessel occlusion. 3. Negative MRA neck without and with contrast. No focal stenosis. Flow is antegrade in the vertebral arteries bilaterally. These results were called by telephone at the time of interpretation on 01/12/2018 at 12:46 pm to Dr. Ritta Slot , who verbally acknowledged these results. Electronically Signed   By: Marin Roberts M.D.   On: 01/12/2018 12:46   Ct Venogram Head  Result Date: 01/11/2018 CLINICAL DATA:  Altered mental status and intermittent aphasia. EXAM: CT ANGIOGRAPHY HEAD CT VENOGRAPHY HEAD TECHNIQUE: Multidetector CT imaging of the head was performed using the standard protocol during bolus administration of intravenous contrast. Images were acquired in arterial and venous phases. Multiplanar CT image reconstructions and MIPs were obtained to evaluate the vascular anatomy. CONTRAST:  80mL ISOVUE-370 IOPAMIDOL (ISOVUE-370) INJECTION 76% COMPARISON:  Head CT 01/11/2018 FINDINGS: CTA HEAD FINDINGS ANTERIOR CIRCULATION: --Intracranial internal carotid arteries: Normal. --Anterior cerebral arteries: Normal. Both A1 segments are present. Patent anterior communicating artery. --Middle cerebral arteries: Normal. --Posterior communicating arteries: Present on the left, absent on the right. POSTERIOR CIRCULATION: --Basilar artery: Normal. --Posterior cerebral arteries: Normal. --Superior cerebellar arteries: Normal. --Inferior cerebellar arteries: Normal anterior and posterior inferior cerebellar arteries. ANATOMIC VARIANTS: None DELAYED PHASE: No parenchymal contrast enhancement. Review of the MIP images confirms the above findings. CT VENOGRAM FINDINGS Superior sagittal sinus: Normal. Straight sinus: Normal.  Inferior sagittal sinus, vein of Galen and internal cerebral veins: Normal. Transverse sinuses: Normal. Normal variant hypoplastic left transverse sinus. Sigmoid  sinuses: Normal. Visualized jugular veins: Normal. IMPRESSION: Normal intracranial CTA and CTV. Electronically Signed   By: Deatra Robinson M.D.   On: 01/11/2018 22:45     Assessment/Plan: 22 yr old lady with chronic migraines who presents with AMS and aphasia  # Acute transient neurologic symptoms with aphasia, AMS- this is now resolved. Ddx: TIA, stroke, venous thrombosis, seizure, complicated migraine. Extensive wk up neg for stroke or thrombosis. MRI/MRA neg. CTA/CTV neg. EEG normal. This event was most likely r/t a complex migraine. # Complicated Migraine- Pt describes cyclic type migraines, but has not used any rescue agents before or had complicated features. # Daily intractable Headaches- pt uses OTC meds for daily control.   PLAN/RECS: Imitrex 25mg  PO PRN migraine, may repeat x1 in 2h if no relief, She will need an RX for this upon d/c I have instructed the pt to keep a h/a diary to follow the cycles I have d/w pt and mom re: use of birth control with estrogen and recommend GYN f/u to discuss other [facility] options Out pt follow up with Dr Lucia Gaskins, headache specialist is placed in depart I have d/w Dr Amada Jupiter, attending neurologist  Attending neurologist's note to follow  I have seen the patient and reviewed the above note.  22 year old with transient word finding difficulty followed by headache.  Especially with her family history of complicated migraine, and the fact that she reports visual aura with migraines in the past, I think that this is by far the most likely etiology of her changes.  Initially I recommended an EEG which is available at the time of finalizing this note and is negative.  At this point I think any further diagnostic testing would be very low yield, and have no further recommendations at this time.  Ritta Slot, MD [facility] [phone]  If 7pm- 7am, please page neurology on call as listed in AMION.

## 2018-01-12 NOTE — ED Notes (Signed)
Bed: WA13 Expected date:  Expected time:  Means of arrival:  Comments: resus b 

## 2018-01-12 NOTE — Procedures (Signed)
History: 22 yo F with transient aphasia  Sedation: None  Technique: This is a 21 channel routine scalp EEG performed at the bedside with bipolar and monopolar montages arranged in accordance to the international 10/20 system of electrode placement. One channel was dedicated to EKG recording.    Background: The background consists of intermixed alpha and beta activities. There is a well defined posterior dominant rhythm of 9-10 Hz that attenuates with eye opening. Sleep is recorded with normal appearing structures.   Photic stimulation: Physiologic driving is not performed  EEG Abnormalities: None  Clinical Interpretation: This normal EEG is recorded in the waking and sleep state. There was no seizure or seizure predisposition recorded on this study. Please note that a normal EEG does not preclude the possibility of epilepsy.   Ritta Slot, MD [facility] [phone]  If 7pm- 7am, please page neurology on call as listed in AMION.

## 2018-01-13 DIAGNOSIS — G43109 Migraine with aura, not intractable, without status migrainosus: Secondary | ICD-10-CM

## 2018-02-26 ENCOUNTER — Other Ambulatory Visit (HOSPITAL_COMMUNITY)
Admission: RE | Admit: 2018-02-26 | Discharge: 2018-02-26 | Disposition: A | Discharge status: Home / Self Care | Payer: 59 | Source: Ambulatory Visit | Attending: Nurse Practitioner | Admitting: Nurse Practitioner

## 2018-02-26 ENCOUNTER — Other Ambulatory Visit: Payer: Self-pay | Admitting: Nurse Practitioner

## 2018-02-26 DIAGNOSIS — Z01419 Encounter for gynecological examination (general) (routine) without abnormal findings: Secondary | ICD-10-CM | POA: Diagnosis not present

## 2018-03-02 LAB — CYTOLOGY - PAP
CHLAMYDIA, DNA PROBE: NEGATIVE
Diagnosis: NEGATIVE
Neisseria Gonorrhea: NEGATIVE

## 2018-03-18 ENCOUNTER — Ambulatory Visit: Payer: Self-pay | Admitting: Neurology

## 2018-03-18 ENCOUNTER — Encounter

## 2018-03-18 ENCOUNTER — Telehealth: Payer: Self-pay | Admitting: *Deleted

## 2018-03-18 NOTE — Telephone Encounter (Signed)
Pt no showed new pt appt on 03/18/2018 @ 1:30 pm.

## 2018-03-19 ENCOUNTER — Encounter: Payer: Self-pay | Admitting: Neurology

## 2018-09-02 ENCOUNTER — Encounter: Payer: Self-pay | Admitting: Neurology

## 2018-09-02 ENCOUNTER — Ambulatory Visit (INDEPENDENT_AMBULATORY_CARE_PROVIDER_SITE_OTHER): Payer: 59 | Admitting: Neurology

## 2018-09-02 ENCOUNTER — Other Ambulatory Visit: Payer: Self-pay

## 2018-09-02 VITALS — BP 119/67 | HR 77 | Resp 16 | Ht 64.0 in | Wt 142.8 lb

## 2018-09-02 DIAGNOSIS — G43711 Chronic migraine without aura, intractable, with status migrainosus: Secondary | ICD-10-CM

## 2018-09-02 DIAGNOSIS — G43119 Migraine with aura, intractable, without status migrainosus: Secondary | ICD-10-CM

## 2018-09-02 MED ORDER — ONDANSETRON 4 MG PO TBDP: 4.0000 mg | ORAL_TABLET | Freq: Three times a day (TID) | ORAL | 3 refills | Status: DC | PRN

## 2018-09-02 MED ORDER — ERENUMAB-AOOE 140 MG/ML ~~LOC~~ SOAJ: 140.0000 mg | SUBCUTANEOUS | 11 refills | Status: DC

## 2018-09-02 MED ORDER — RIZATRIPTAN BENZOATE 10 MG PO TBDP: 10.0000 mg | ORAL_TABLET | ORAL | 11 refills | Status: DC | PRN

## 2018-09-02 NOTE — Progress Notes (Signed)
[facility]    Provider:  Dr Amy Boyd Referring Provider: [name], [name], MD Primary Care Physician:  Amy JamesSun, [name], MD  CC:  Migraine  HPI:  Amy Boyd is a 23 y.o. female here as requested by Amy. Wynelle Boyd for chronic migraines. She has daily headaches. The migraines are unilateral behind the left eye with associated neck pain, pulsating and pounding, she has nausea, she has vomited in the past, light and sound sensitivity, moving makes it worse. She has dizziness and vertigo. Can last 24-72 days untreated. Associated lethargy. Excedrin migraine helps but no medication overuse. 10 migraine days a month. Tizanidine helped. She has had migraines since a child. Mother has migraines. No aura, several times had stroke-like symptoms which resolved, no visual aura. She has associated aphasia with the migraines due to pain. The migraine are moderately severe to severe and interrupt her daily life. A dark room helps.   Tizanidine: imitrex, prednisone, reglan, Topamax, Qudexy, Lamictal, celexa,   Reviewed notes, labs and imaging from outside physicians, which showed:  Reviewed MRI of the brain images which were normal 01/12/2018  06/23/2018 CBC unremarkable, CMP normal  Review of Systems: Patient complains of symptoms per HPI as well as the following symptoms: headache. Pertinent negatives and positives per HPI. All others negative.   Social History   Socioeconomic History  . Marital status: Single    Spouse name: Not on file  . Number of children: Not on file  . Years of education: Not on file  . Highest education level: Not on file  Occupational History  . Not on file  Social Needs  . Financial resource strain: Not on file  . Food insecurity:    Worry: Not on file    Inability: Not on file  . Transportation needs:    Medical: Not on file    Non-medical: Not on file  Tobacco Use  . Smoking status: Current Some Day Smoker  . Smokeless tobacco: Never Used  Substance and  Sexual Activity  . Alcohol use: Yes    Comment: Socially  . Drug use: No  . Sexual activity: Not on file  Lifestyle  . Physical activity:    Days per week: Not on file    Minutes per session: Not on file  . Stress: Not on file  Relationships  . Social connections:    Talks on phone: Not on file    Gets together: Not on file    Attends religious service: Not on file    Active member of club or organization: Not on file    Attends meetings of clubs or organizations: Not on file    Relationship status: Not on file  . Intimate partner violence:    Fear of current or ex partner: Not on file    Emotionally abused: Not on file    Physically abused: Not on file    Forced sexual activity: Not on file  Other Topics Concern  . Not on file  Social History Narrative  . Not on file    Family History  Problem Relation Age of Onset  . Migraines Mother     Past Medical History:  Diagnosis Date  . Migraine headache     Past Surgical History:  Procedure Laterality Date  . WISDOM TOOTH EXTRACTION    . WRIST SURGERY Left     Current Outpatient Medications  Medication Sig Dispense Refill  . amphetamine-dextroamphetamine (ADDERALL XR) 15 MG 24 hr capsule     . amphetamine-dextroamphetamine (  ADDERALL XR) 20 MG 24 hr capsule     . citalopram (CELEXA) 20 MG tablet     . hydrOXYzine (ATARAX/VISTARIL) 25 MG tablet Take by mouth.    . lamoTRIgine (LAMICTAL) 25 MG tablet Take by mouth.    . Levonorgestrel (KYLEENA) 19.5 MG IUD by Intrauterine route.    Dorise Hiss. Erenumab-aooe (AIMOVIG) 140 MG/ML SOAJ Inject 140 mg into the skin every 30 (thirty) days. 3 pen 11  . ondansetron (ZOFRAN-ODT) 4 MG disintegrating tablet Take 1 tablet (4 mg total) by mouth every 8 (eight) hours as needed for nausea. 30 tablet 3  . rizatriptan (MAXALT-MLT) 10 MG disintegrating tablet Take 1 tablet (10 mg total) by mouth as needed for migraine. May repeat in 2 hours if needed 9 tablet 11   No current facility-administered  medications for this visit.     Allergies as of 09/02/2018  . (No Known Allergies)    Vitals: BP 119/67   Pulse 77   Resp 16   Ht 5\' 4"  (1.626 m)   Wt 142 lb 12.8 oz (64.8 kg)   BMI 24.51 kg/m  Last Weight:  Wt Readings from Last 1 Encounters:  09/02/18 142 lb 12.8 oz (64.8 kg)   Last Height:   Ht Readings from Last 1 Encounters:  09/02/18 5\' 4"  (1.626 m)   Physical exam: Exam: Gen: NAD, conversant, well nourised, well groomed                     CV: RRR, no MRG. No Carotid Bruits. No peripheral edema, warm, nontender Eyes: Conjunctivae clear without exudates or hemorrhage  Neuro: Detailed Neurologic Exam  Speech:    Speech is normal; fluent and spontaneous with normal comprehension.  Cognition:    The patient is oriented to person, place, and time;     recent and remote memory intact;     language fluent;     normal attention, concentration,     fund of knowledge Cranial Nerves:    The pupils are equal, round, and reactive to light. The fundi are normal and spontaneous venous pulsations are present. Visual fields are full to finger confrontation. Extraocular movements are intact. Trigeminal sensation is intact and the muscles of mastication are normal. The face is symmetric. The palate elevates in the midline. Hearing intact. Voice is normal. Shoulder shrug is normal. The tongue has normal motion without fasciculations.   Coordination:    Normal finger to nose and heel to shin. Normal rapid alternating movements.   Gait:    Heel-toe and tandem gait are normal.   Motor Observation:    No asymmetry, no atrophy, and no involuntary movements noted. Tone:    Normal muscle tone.    Posture:    Posture is normal. normal erect    Strength:    Strength is V/V in the upper and lower limbs.      Sensation: intact to LT     Reflex Exam:  DTR's:    Deep tendon reflexes in the upper and lower extremities are normal bilaterally.   Toes:    The toes are downgoing  bilaterally.   Clonus:    Clonus is absent.       Assessment/Plan:  Very nice young women with chronic intractable migraines. 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  Acutely: Maxalt(Rizatriptan) and Zofran (Ondansetron). Please take one tablet at the onset of your headache. If it does not  improve the symptoms please take one additional tablet. Do not take more then 2 tablets in 24hrs. Do not take use more then 2 to 3 times in a week.  Preventative: Aimovig, do not get pregnant  Discussed: There is increased risk for stroke in women with migraine with aura and a  Contraindication for the combined contraceptive pill for use by women who have migraine with aura, which is in line with World Health Organisation recommendations. The risk for women with migraine without aura is lower and other risk factors like smoking are far more likely to increase stroke risk than migraine. There is a recommendation for no smoking and for the use of low estrogen or progestogen only pills particularly for women with migraine with aura. It is important however that women with migraine who are taking the pill do not decide to suddenly stop taking it without discussing this with their doctor. Please discuss with her OB/GYN.  Discussed: To prevent or relieve headaches, try the following: Cool Compress. Lie down and place a cool compress on your head.  Avoid headache triggers. If certain foods or odors seem to have triggered your migraines in the past, avoid them. A headache diary might help you identify triggers.  Include physical activity in your daily routine. Try a daily walk or other moderate aerobic exercise.  Manage stress. Find healthy ways to cope with the stressors, such as delegating tasks on your to-do list.  Practice relaxation techniques. Try deep breathing, yoga, massage and visualization.  Eat regularly. Eating regularly scheduled meals and  maintaining a healthy diet might help prevent headaches. Also, drink plenty of fluids.  Follow a regular sleep schedule. Sleep deprivation might contribute to headaches Consider biofeedback. With this mind-body technique, you learn to control certain bodily functions - such as muscle tension, heart rate and blood pressure - to prevent headaches or reduce headache pain.    Proceed to emergency room if you experience new or worsening symptoms or symptoms do not resolve, if you have new neurologic symptoms or if headache is severe, or for any concerning symptom.   Provided education and documentation from American headache Society toolbox including articles on: chronic migraine medication overuse headache, chronic migraines, prevention of migraines, behavioral and other nonpharmacologic treatments for headache.   CC:Amy. Lynnda Child, MD  [facility] [address] [place], [place] [address]  Phone [phone] Fax [phone]

## 2018-09-02 NOTE — Patient Instructions (Addendum)
Acutely: Maxalt(Rizatriptan) and Zofran (Ondansetron). Please take one tablet at the onset of your headache. If it does not improve the symptoms please take one additional tablet. Do not take more then 2 tablets in 24hrs. Do not take use more then 2 to 3 times in a week.  Preventative: Aimovig  Erenumab: Patient drug information [employer] Online here. Copyright [phone] [phone] [employer] All rights reserved. (For additional information see "Erenumab: Drug information") Brand Names: [place]  Aimovig;  Aimovig (140 MG Dose) [DSC]  Brand Names: [place]  Aimovig  What is this drug used for?   It is used to prevent migraine headaches.  What do I need to tell my doctor BEFORE I take this drug?   If you are allergic to this drug; any part of this drug; or any other drugs, foods, or substances. Tell your doctor about the allergy and what signs you had.   This drug may interact with other drugs or health problems.   Tell your doctor and pharmacist about all of your drugs (prescription or OTC, natural products, vitamins) and health problems. You must check to make sure that it is safe for you to take this drug with all of your drugs and health problems. Do not start, stop, or change the dose of any drug without checking with your doctor.  What are some things I need to know or do while I take this drug?   Tell all of your health care providers that you take this drug. This includes your doctors, nurses, pharmacists, and dentists.   If you have a latex allergy, talk with your doctor.   Tell your doctor if you are pregnant, plan on getting pregnant, or are breast-feeding. You will need to talk about the benefits and risks to you and the baby.  What are some side effects that I need to call my doctor about right away?   WARNING/CAUTION: Even though it may be rare, some people may have very bad and sometimes deadly side effects when taking a drug. Tell your doctor or get medical help right away if  you have any of the following signs or symptoms that may be related to a very bad side effect:   Signs of an allergic reaction, like rash; hives; itching; red, swollen, blistered, or peeling skin with or without fever; wheezing; tightness in the chest or throat; trouble breathing, swallowing, or talking; unusual hoarseness; or swelling of the mouth, face, lips, tongue, or throat.  What are some other side effects of this drug?   All drugs may cause side effects. However, many people have no side effects or only have minor side effects. Call your doctor or get medical help if any of these side effects or any other side effects bother you or do not go away:   Pain, redness, or swelling where the shot was given.   Constipation is common with this drug. In some people, severe constipation led to treatment in a hospital or surgery. Call your doctor right away if you have constipation that is severe or does not go away.   These are not all of the side effects that may occur. If you have questions about side effects, call your doctor. Call your doctor for medical advice about side effects.   You may report side effects to your national health agency.  How is this drug best taken?   Use this drug as ordered by your doctor. Read all information given to you. Follow all instructions closely.  It is given as a shot into the fatty part of the skin on the top of the thigh, belly area, or upper arm.   If you will be giving yourself the shot, your doctor or nurse will teach you how to give the shot.   If stored in a refrigerator, let this drug come to room temperature before using it. Leave it at room temperature for at least 30 minutes. Do not heat this drug.   Protect from heat and sunlight.   Do not shake.   Do not give into skin within 2 inches of the belly button.   Do not give into skin that is irritated, tender, bruised, red, scaly, hard, scarred, or has stretch marks.   Do not use if the solution is  cloudy, leaking, or has particles.   This drug is colorless to a faint yellow. Do not use if the solution changes color.   Throw away after using. Do not use the device more than 1 time.   Throw away needles in a needle/sharp disposal box. Do not reuse needles or other items. When the box is full, follow all local rules for getting rid of it. Talk with a doctor or pharmacist if you have any questions.  What do I do if I miss a dose?   Take a missed dose as soon as you think about it.   After taking a missed dose, start a new schedule based on when the dose is taken.  How do I store and/or throw out this drug?   Store in a refrigerator. Do not freeze.   Store in the original container to protect from light.   Do not use if it has been frozen.   If you drop this drug on a hard surface, do not use it.   If needed, you may store at room temperature for up to 7 days. Write down the date you take this drug out of the refrigerator. If stored at room temperature and not used within 7 days, throw this drug away.   Do not put this drug back in the refrigerator after it has been stored at room temperature.   Keep all drugs in a safe place. Keep all drugs out of the reach of children and pets.   Throw away unused or expired drugs. Do not flush down a toilet or pour down a drain unless you are told to do so. Check with your pharmacist if you have questions about the best way to throw out drugs. There may be drug take-back programs in your area.  General drug facts   If your symptoms or health problems do not get better or if they become worse, call your doctor.   Do not share your drugs with others and do not take anyone else's drugs.   Some drugs may have another patient information leaflet. If you have any questions about this drug, please talk with your doctor, nurse, pharmacist, or other health care provider.   If you think there has been an overdose, call your poison control center or get medical care  right away. Be ready to tell or show what was taken, how much, and when it happened.

## 2018-09-03 ENCOUNTER — Encounter: Payer: Self-pay | Admitting: Neurology

## 2018-09-03 DIAGNOSIS — G43711 Chronic migraine without aura, intractable, with status migrainosus: Secondary | ICD-10-CM | POA: Insufficient documentation

## 2018-09-03 DIAGNOSIS — G43119 Migraine with aura, intractable, without status migrainosus: Secondary | ICD-10-CM | POA: Insufficient documentation

## 2018-09-16 ENCOUNTER — Other Ambulatory Visit: Payer: Self-pay | Admitting: Physician Assistant

## 2018-09-16 DIAGNOSIS — S0990XA Unspecified injury of head, initial encounter: Secondary | ICD-10-CM

## 2018-09-17 ENCOUNTER — Ambulatory Visit
Admission: RE | Admit: 2018-09-17 | Discharge: 2018-09-17 | Disposition: A | Discharge status: Home / Self Care | Payer: 59 | Source: Ambulatory Visit | Attending: Physician Assistant | Admitting: Physician Assistant

## 2018-09-17 DIAGNOSIS — S0990XA Unspecified injury of head, initial encounter: Secondary | ICD-10-CM

## 2018-09-29 ENCOUNTER — Other Ambulatory Visit: Payer: Self-pay | Admitting: Neurology

## 2018-09-29 MED ORDER — PROCHLORPERAZINE MALEATE 5 MG PO TABS: 5.0000 mg | ORAL_TABLET | Freq: Four times a day (QID) | ORAL | 2 refills | Status: DC | PRN

## 2018-09-29 MED ORDER — METOCLOPRAMIDE HCL 10 MG PO TABS: 10.0000 mg | ORAL_TABLET | Freq: Four times a day (QID) | ORAL | 4 refills | Status: DC | PRN

## 2018-09-29 MED ORDER — RIZATRIPTAN BENZOATE 10 MG PO TBDP: 10.0000 mg | ORAL_TABLET | ORAL | 11 refills | Status: AC | PRN

## 2018-11-09 ENCOUNTER — Other Ambulatory Visit: Payer: Self-pay | Admitting: Neurology

## 2018-11-09 MED ORDER — GABAPENTIN 300 MG PO CAPS: 300.0000 mg | ORAL_CAPSULE | Freq: Three times a day (TID) | ORAL | 11 refills | Status: DC

## 2018-11-09 MED ORDER — ONDANSETRON 4 MG PO TBDP: 4.0000 mg | ORAL_TABLET | Freq: Three times a day (TID) | ORAL | 3 refills | Status: AC | PRN

## 2018-12-01 ENCOUNTER — Other Ambulatory Visit: Payer: Self-pay | Admitting: Neurology

## 2019-04-23 ENCOUNTER — Telehealth: Payer: Self-pay | Admitting: Neurology

## 2019-04-23 NOTE — Telephone Encounter (Signed)
Pt is asking for a call from RN to discuss staring back on Aimovig.  Pt was asked if she wanted to schedule her f/u from May she asked the RN calls her on Tues 1st

## 2019-04-27 MED ORDER — AIMOVIG 140 MG/ML ~~LOC~~ SOAJ: 140.0000 mg | SUBCUTANEOUS | 3 refills | Status: AC

## 2019-04-27 NOTE — Telephone Encounter (Signed)
I spoke with the patient. She asked for an Aimovig refill to her pharmacy. It helps and she takes Rizatriptan prn migraines. She no longer takes Gabapentin. 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 She also does not take Compazine. She understands an appt is needed at least once/year for refills. When she gets her school schedule she will call back to schedule a follow-up. For now she states she is fine and would like to get the Aimovig refilled. Pt was advised of the Aimovig savings card which she can download and activate from [email or web address]. I advised a message would be sent to Dr. Jaynee Eagles with the update and if needed we will call her back. Pt verbalized understanding and appreciation for the call.  Aimovig refilled.

## 2019-04-27 NOTE — Addendum Note (Signed)
Addended by: Gildardo Griffes on: 04/27/2019 04:40 PM   Modules accepted: Orders

## 2020-03-07 ENCOUNTER — Other Ambulatory Visit: Payer: Self-pay

## 2020-03-07 ENCOUNTER — Emergency Department: Payer: Worker's Compensation

## 2020-03-07 ENCOUNTER — Emergency Department
Admission: EM | Admit: 2020-03-07 | Discharge: 2020-03-07 | Disposition: A | Discharge status: Home / Self Care | Payer: Worker's Compensation | Attending: Emergency Medicine | Admitting: Emergency Medicine

## 2020-03-07 DIAGNOSIS — Y9289 Other specified places as the place of occurrence of the external cause: Secondary | ICD-10-CM | POA: Diagnosis not present

## 2020-03-07 DIAGNOSIS — Y939 Activity, unspecified: Secondary | ICD-10-CM | POA: Diagnosis not present

## 2020-03-07 DIAGNOSIS — W228XXA Striking against or struck by other objects, initial encounter: Secondary | ICD-10-CM | POA: Diagnosis not present

## 2020-03-07 DIAGNOSIS — F17210 Nicotine dependence, cigarettes, uncomplicated: Secondary | ICD-10-CM | POA: Diagnosis not present

## 2020-03-07 DIAGNOSIS — Y999 Unspecified external cause status: Secondary | ICD-10-CM | POA: Diagnosis not present

## 2020-03-07 DIAGNOSIS — S0990XA Unspecified injury of head, initial encounter: Secondary | ICD-10-CM | POA: Insufficient documentation

## 2020-03-07 NOTE — ED Notes (Addendum)
This RN attempted to call pt's bosses number Lenon Oms: [phone]) in order to get approval to preform a workers comp on behalf of the pt. Pt st her boss told her to get seen today (3days) post incident and that after the incident she was told to continue working. Pt gave this RN permission to leave a voicemail indicating the need of permission to file a workers comp on the number indicated above. Pt made aware of the inability to make contact.   Pt works at [employer] in [place] and does not have a profile on file for workers comp at this time

## 2020-03-07 NOTE — ED Notes (Signed)
Amy Boyd, returned a call to this RN and informed me that the company [employer] doe NOT require a UDS in order to preform a workers comp claim. ED Charge notified at this time.

## 2020-03-07 NOTE — Discharge Instructions (Signed)
Return to the ER if symptoms change or worsen.

## 2020-03-07 NOTE — ED Notes (Signed)
Pt's boss Lenon Oms returned the call to this RN.   Per Cicero Duck, permission has been granted via the [employer] in [place] under their chain of command to preform a UDS for a workers comp claim. Kathrene Alu asked to please send over a COC form or identify the appropriate profile drug panel that is required via their company.   Will await a return call back. Cicero Duck was given the fax number to the main ER in order to fax over the COC if unable to hand deliver it to this ER

## 2020-03-07 NOTE — ED Provider Notes (Signed)
[facility] Emergency Department Provider Note ____________________________________________   First MD Initiated Contact with Patient 03/07/20 1923     (approximate)  I have reviewed the triage vital signs and the nursing notes.   HISTORY  Chief Complaint Head Injury  HPI Amy Boyd is a 24 y.o. female who presents to the emergency department for treatment and evaluation of head and neck injury while at work on Saturday.  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  She was under the table trying to screw the leg in securely when a caddy that was sitting on top of the table plus the heavy wooden table all came down on top of her head and neck.  She states that it was approximately 80 pounds.  She states that since that time she has had a headache and neck pain.  She states that upon awakening the next morning, she noticed that her nose was congested and she has had continuous clear mucus draining from her nose.  No relief of headache or neck pain with Tylenol.         Past Medical History:  Diagnosis Date  . Migraine headache     Patient Active Problem List   Diagnosis Date Noted  . Chronic migraine without aura, with intractable migraine, so stated, with status migrainosus 09/03/2018  . Intractable migraine with aura without status migrainosus 09/03/2018  . Complicated migraine 01/13/2018  . Aphasia 01/11/2018    Past Surgical History:  Procedure Laterality Date  . WISDOM TOOTH EXTRACTION    . WRIST SURGERY Left     Prior to Admission medications   Medication Sig Start Date End Date Taking? Authorizing Provider  amphetamine-dextroamphetamine (ADDERALL XR) 15 MG 24 hr capsule  08/05/18   [provider]  amphetamine-dextroamphetamine (ADDERALL XR) 20 MG 24 hr capsule  08/05/18   [provider]  citalopram (CELEXA) 20 MG tablet  07/31/18   [provider]    Erenumab-aooe (AIMOVIG) 140 MG/ML SOAJ Inject 140 mg into the skin every 30 (thirty) days. 04/27/19   Anson Fret, MD  gabapentin (NEURONTIN) 300 MG capsule TAKE 1 CAPSULE BY MOUTH THREE TIMES A DAY Patient not taking: Reported on 04/27/2019 12/01/18   Anson Fret, MD  hydrOXYzine (ATARAX/VISTARIL) 25 MG tablet Take by mouth.    [provider]  lamoTRIgine (LAMICTAL) 25 MG tablet Take by mouth.    [provider]  Levonorgestrel (KYLEENA) 19.5 MG IUD by Intrauterine route.    [provider]  ondansetron (ZOFRAN-ODT) 4 MG disintegrating tablet Take 1 tablet (4 mg total) by mouth every 8 (eight) hours as needed for nausea. Stop Reglan (metoclopramide) and the Compazine (prochloperazine). 11/09/18   Anson Fret, MD  rizatriptan (MAXALT-MLT) 10 MG disintegrating tablet Take 1 tablet (10 mg total) by mouth as needed for migraine. May repeat in 2 hours if needed 09/29/18   Anson Fret, MD    Allergies Patient has no known allergies.  Family History  Problem Relation Age of Onset  . Migraines Mother     Social History Social History   Tobacco Use  . Smoking status: Current Some Day Smoker  . Smokeless tobacco: Never Used  Substance Use Topics  . Alcohol use: Yes    Comment: Socially  . Drug use: No    Review of Systems  Constitutional: No fever/chills Eyes: No visual changes. ENT: No sore throat. Cardiovascular:  Denies chest pain. Respiratory: Denies shortness of breath. Gastrointestinal: No abdominal pain.  No nausea, no vomiting.  No diarrhea.  No constipation. Genitourinary: Negative for dysuria. Musculoskeletal: Negative for back pain. Positive for neck pain. Skin: Negative for rash. Neurological: Positive for headaches, focal weakness or numbness. ____________________________________________   PHYSICAL EXAM:  VITAL SIGNS: ED Triage Vitals  Enc Vitals Group     BP 03/07/20 1841 131/89     Pulse Rate 03/07/20 1841 88     Resp  03/07/20 1841 18     Temp 03/07/20 1841 98.7 F (37.1 C)     Temp src --      SpO2 03/07/20 1841 97 %     Weight 03/07/20 1841 160 lb (72.6 kg)     Height 03/07/20 1841 5\' 4"  (1.626 m)     Head Circumference --      Peak Flow --      Pain Score 03/07/20 1840 6     Pain Loc --      Pain Edu? --      Excl. in GC? --     Constitutional: Alert and oriented. Well appearing and in no acute distress. Eyes: Conjunctivae are normal. PERRL. EOMI. Head: Atraumatic. Nose: No congestion/rhinnorhea. Mouth/Throat: Mucous membranes are moist.  Oropharynx non-erythematous. Neck: No stridor.   Hematological/Lymphatic/Immunilogical: No cervical lymphadenopathy. Cardiovascular: Normal rate, regular rhythm. Grossly normal heart sounds.  Good peripheral circulation. Respiratory: Normal respiratory effort.  No retractions. Lungs CTAB. Gastrointestinal: Soft and nontender. No distention. No abdominal bruits. No CVA tenderness. Genitourinary:  Musculoskeletal: No lower extremity tenderness nor edema.  No joint effusions. Neurologic:  Normal speech and language. No gross focal neurologic deficits are appreciated. No gait instability. Cranial nerves intact. Negative Romberg. Negative heel to shin testing. No nystagmus.  Skin:  Skin is warm, dry and intact. No rash noted. Psychiatric: Mood and affect are normal. Speech and behavior are normal.  ____________________________________________   LABS (all labs ordered are listed, but only abnormal results are displayed)  Labs Reviewed - No data to display ____________________________________________  EKG  Not indicated. ____________________________________________  RADIOLOGY  ED MD interpretation:    CT Head and Cervical Spine images are negative for acute findings.  Official radiology report(s): CT Head Wo Contrast  Result Date: 03/07/2020 CLINICAL DATA:  Metal box fell on her, continued pain EXAM: CT HEAD WITHOUT CONTRAST TECHNIQUE: Contiguous  axial images were obtained from the base of the skull through the vertex without intravenous contrast. COMPARISON:  None. FINDINGS: Brain: No evidence of acute territorial infarction, hemorrhage, hydrocephalus,extra-axial collection or mass lesion/mass effect. Normal gray-white differentiation. Ventricles are normal in size and contour. Vascular: No hyperdense vessel or unexpected calcification. Skull: The skull is intact. No fracture or focal lesion identified. Sinuses/Orbits: The visualized paranasal sinuses and mastoid air cells are clear. The orbits and globes intact. Other: None Cervical spine: Alignment: There is slight straightening of the normal cervical lordosis. Skull base and vertebrae: Visualized skull base is intact. No atlanto-occipital dissociation. The vertebral body heights are well maintained. No fracture or pathologic osseous lesion seen. Soft tissues and spinal canal: The visualized paraspinal soft tissues are unremarkable. No prevertebral soft tissue swelling is seen. The spinal canal is grossly unremarkable, no large epidural collection or significant canal narrowing. Disc levels: No no foraminal or canal stenosis. Upper chest: The lung apices are clear. Thoracic inlet is within normal limits. Other: None IMPRESSION: No acute intracranial abnormality. No acute fracture or malalignment of the spine. Electronically Signed  By: Jonna Clark M.D.   On: 03/07/2020 21:08   CT Cervical Spine Wo Contrast  Result Date: 03/07/2020 CLINICAL DATA:  Metal box fell on her, continued pain EXAM: CT HEAD WITHOUT CONTRAST TECHNIQUE: Contiguous axial images were obtained from the base of the skull through the vertex without intravenous contrast. COMPARISON:  None. FINDINGS: Brain: No evidence of acute territorial infarction, hemorrhage, hydrocephalus,extra-axial collection or mass lesion/mass effect. Normal gray-white differentiation. Ventricles are normal in size and contour. Vascular: No hyperdense vessel  or unexpected calcification. Skull: The skull is intact. No fracture or focal lesion identified. Sinuses/Orbits: The visualized paranasal sinuses and mastoid air cells are clear. The orbits and globes intact. Other: None Cervical spine: Alignment: There is slight straightening of the normal cervical lordosis. Skull base and vertebrae: Visualized skull base is intact. No atlanto-occipital dissociation. The vertebral body heights are well maintained. No fracture or pathologic osseous lesion seen. Soft tissues and spinal canal: The visualized paraspinal soft tissues are unremarkable. No prevertebral soft tissue swelling is seen. The spinal canal is grossly unremarkable, no large epidural collection or significant canal narrowing. Disc levels: No no foraminal or canal stenosis. Upper chest: The lung apices are clear. Thoracic inlet is within normal limits. Other: None IMPRESSION: No acute intracranial abnormality. No acute fracture or malalignment of the spine. Electronically Signed   By: Jonna Clark M.D.   On: 03/07/2020 21:08    ____________________________________________   PROCEDURES  Procedure(s) performed (including Critical Care):  Procedures  ____________________________________________   INITIAL IMPRESSION / ASSESSMENT AND PLAN   24 year old female presenting to the emergency department for treatment and evaluation after minor head injury 3 days ago.  See HPI for further details.  Exam is overall reassuring, however the patient states that she has had clear watery nasal drainage since the morning after the incident.  Although it is highly unlikely that she might have a skull fracture, plan will be to get a CT of the head and cervical spine.    DIFFERENTIAL DIAGNOSIS  Basal skull fracture, minor head injury, concussion  ED COURSE  CT of the head and cervical spine are reassuring as expected.  Patient was advised that the nasal congestion with rhinorrhea was not related to the head  injury.  She will be given head injury instructions and advised to follow-up with primary care or return to the emergency department for symptoms of concern.  She will take Tylenol or ibuprofen for headache or neck pain.  ____________________________________________   FINAL CLINICAL IMPRESSION(S) / ED DIAGNOSES  Final diagnoses:  Minor head injury, initial encounter     ED Discharge Orders    None       Amy Boyd was evaluated in Emergency Department on 03/07/2020 for the symptoms described in the history of present illness. She was evaluated in the context of the global COVID-19 pandemic, which necessitated consideration that the patient might be at risk for infection with the SARS-CoV-2 virus that causes COVID-19. Institutional protocols and algorithms that pertain to the evaluation of patients at risk for COVID-19 are in a state of rapid change based on information released by regulatory bodies including the CDC and federal and state organizations. These policies and algorithms were followed during the patient's care in the ED.   Note:  This document was prepared using Dragon voice recognition software and may include unintentional dictation errors.   Chinita Pester, FNP 03/07/20 2316    Phineas Semen, MD 03/08/20 1524

## 2020-03-07 NOTE — ED Triage Notes (Addendum)
Pt states on the 17th of this month the pt was at work and a metal box fell onto her. Pt states pain to head. Pt states neck pain as well.  Pt wants to file workers comp

## 2020-03-07 NOTE — ED Notes (Signed)
Pt unable to sign E-signature due to signature pad malfunction. Pt verbalized understanding of d/c instructions and had no additional questions or concerns for this RN or provider. Pt left with d/c instructions and gathered all personal belongings from room and removed them prior to ED departure.   

## 2020-08-27 ENCOUNTER — Other Ambulatory Visit: Payer: 59

## 2020-08-27 DIAGNOSIS — Z20822 Contact with and (suspected) exposure to covid-19: Secondary | ICD-10-CM

## 2020-08-30 LAB — NOVEL CORONAVIRUS, NAA: SARS-CoV-2, NAA: NOT DETECTED

## 2020-12-29 IMAGING — CT CT HEAD W/O CM
3 series · 14 of 47 positions shown, 16 images · non-contrast
Comparison: None.

CLINICAL DATA: Metal box fell on her, continued pain

EXAM:
CT HEAD WITHOUT CONTRAST
TECHNIQUE: Contiguous axial images were obtained from the base of the skull
through the vertex without intravenous contrast.

[Series 2: head wo · axial · 0.43mm/px · z∈[-116,+9]mm · 8 of 30 slices shown, 10 images]
[im 3/30  brain]
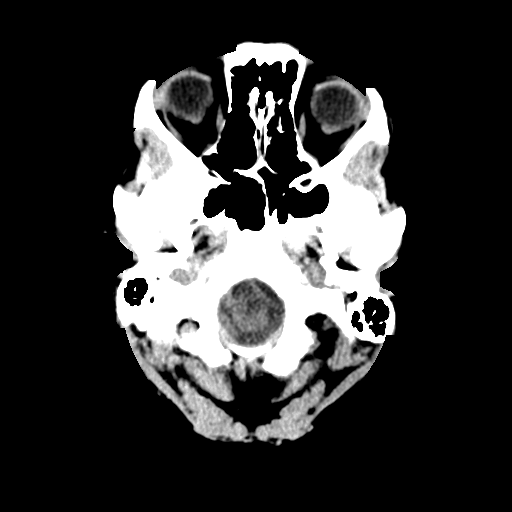
[im 3/30  bone]
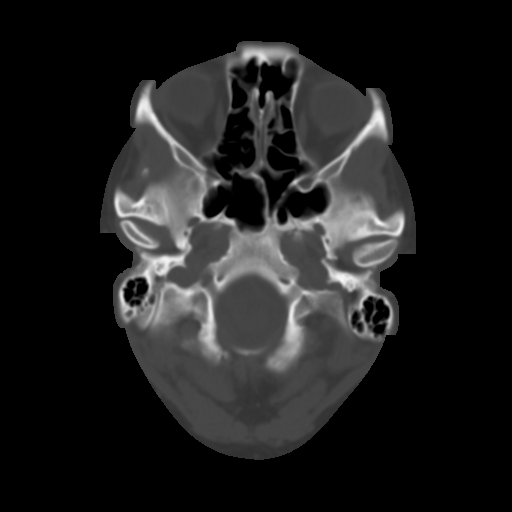
[im 7/30  brain]
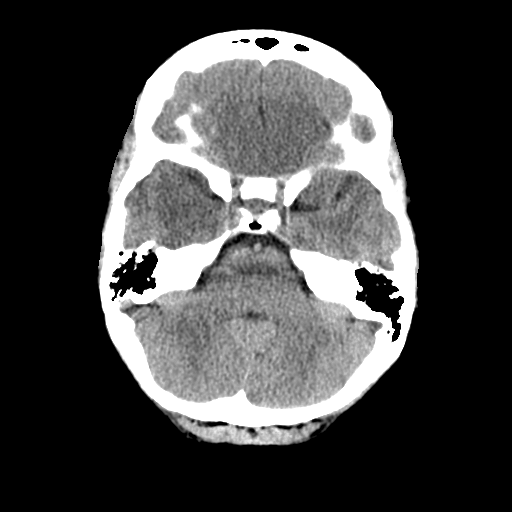
[im 10/30  brain]
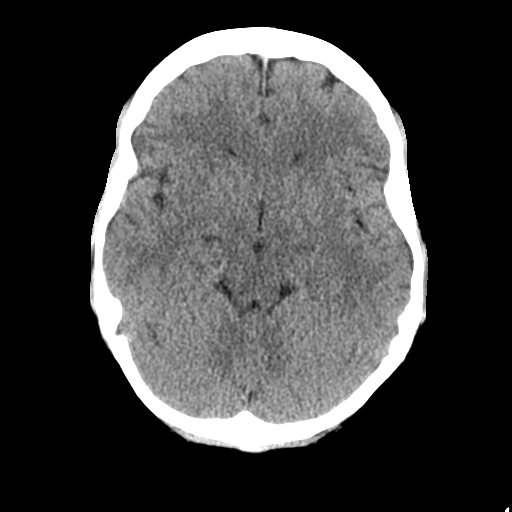
[im 14/30  brain]
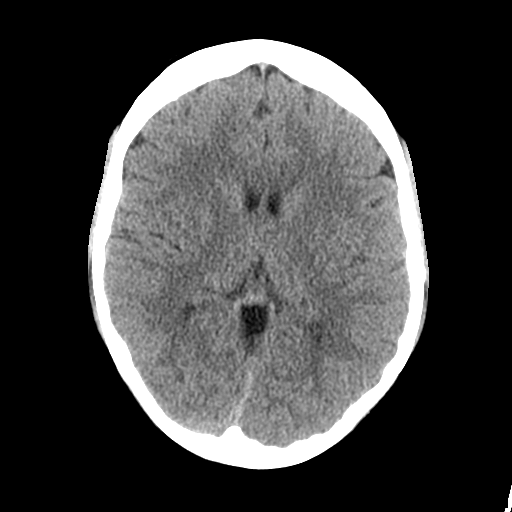
[im 17/30  brain]
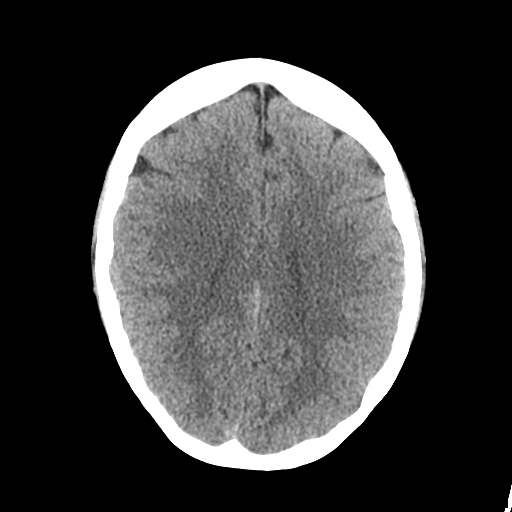
[im 17/30  bone]
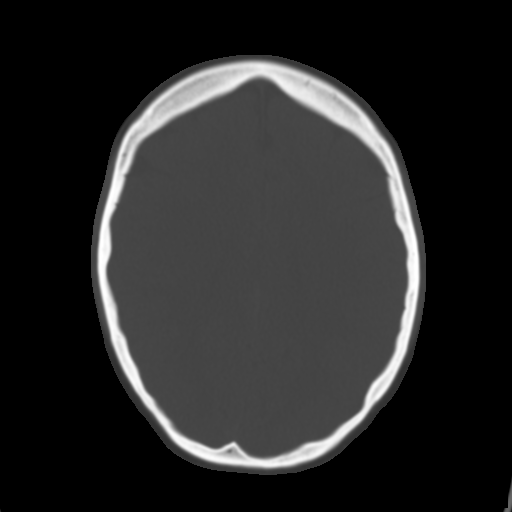
[im 21/30  brain]
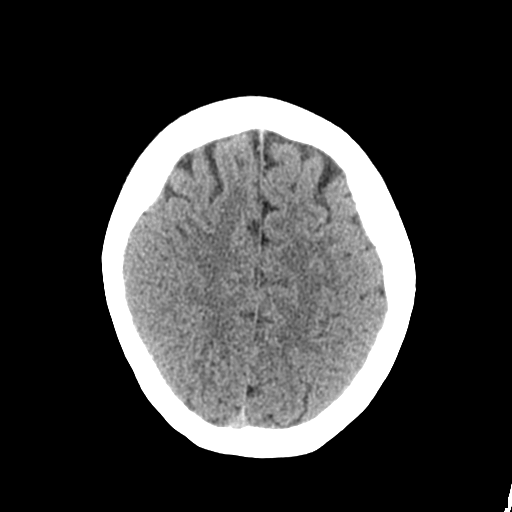
[im 24/30  brain]
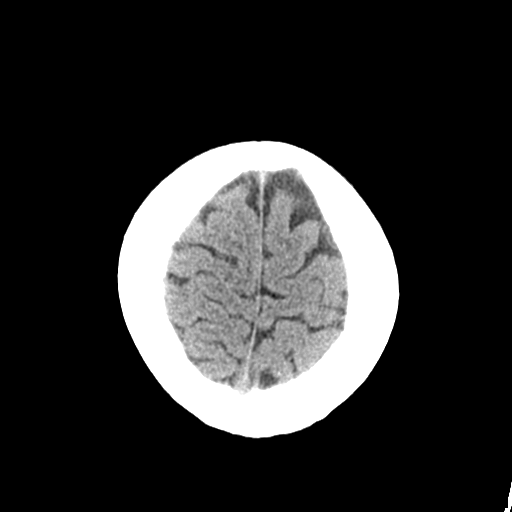
[im 28/30  brain]
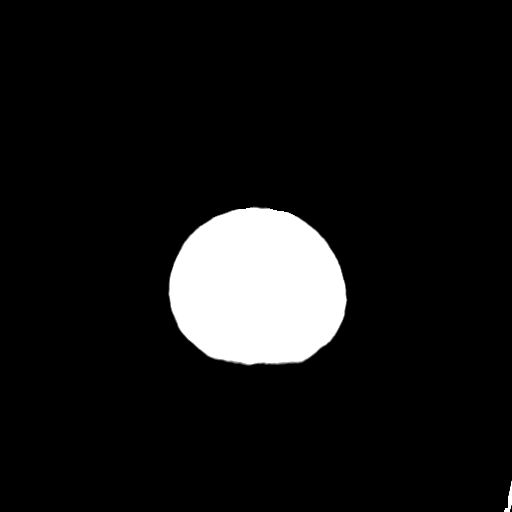

[Series 4: coronal soft tissue · coronal · 0.29mm/px · 3 of 61 slices shown]
[im 21/61  brain]
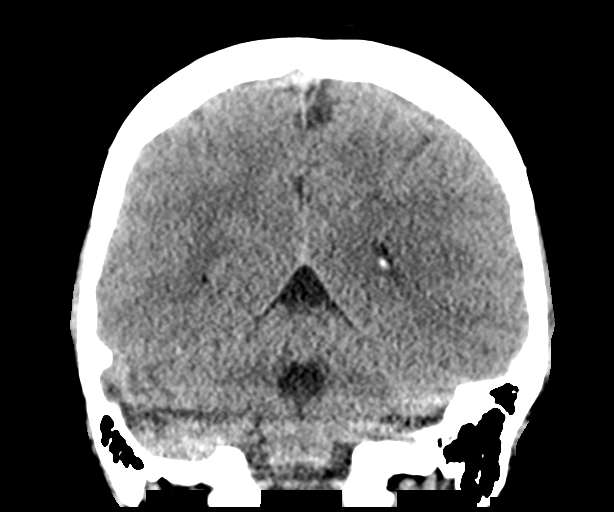
[im 27/61  brain]
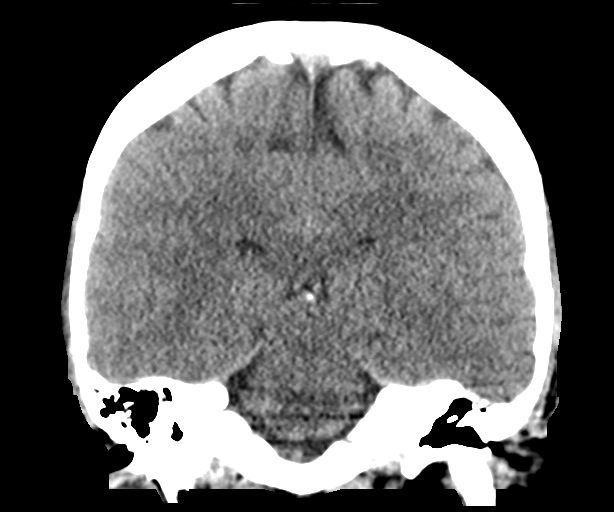
[im 34/61  brain]
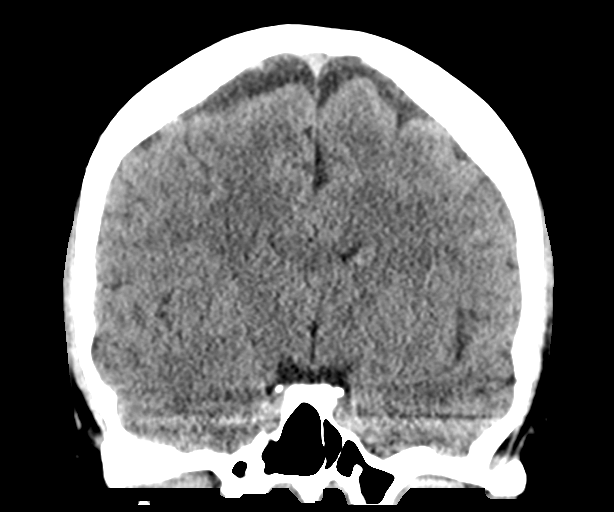

[Series 5: sagittal soft tissue · sagittal · 0.29mm/px · 3 of 53 slices shown]
[im 18/53  brain]
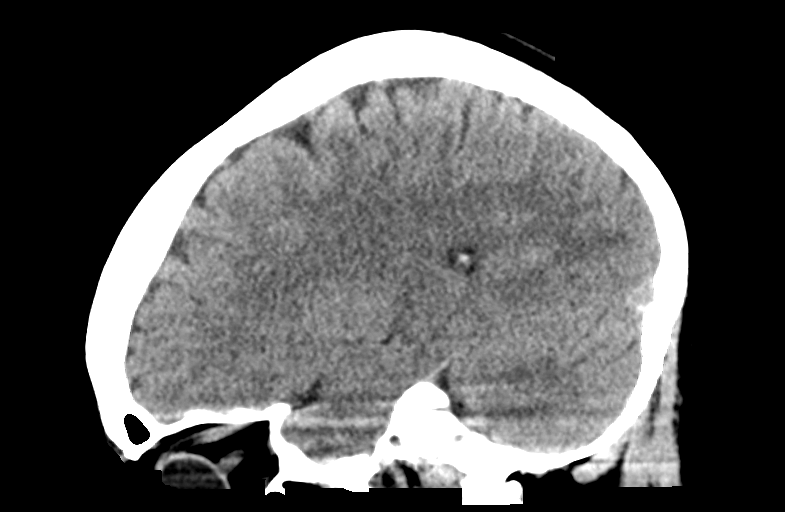
[im 27/53  brain]
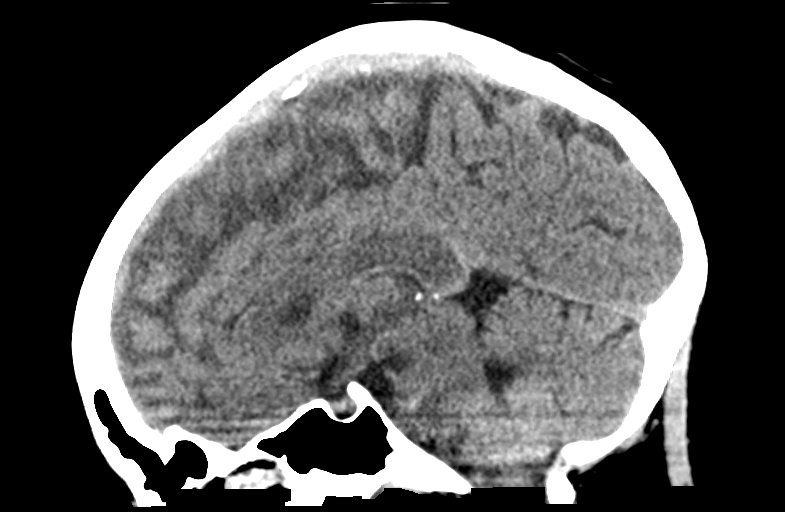
[im 35/53  brain]
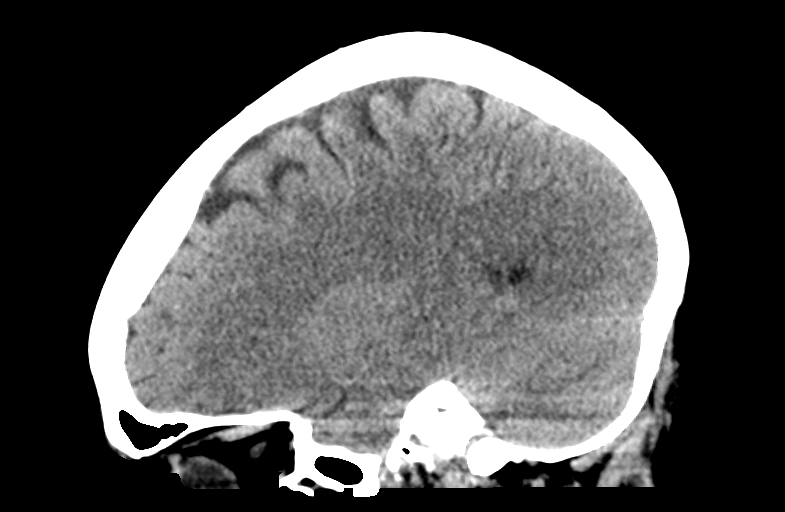

[14 of 47 positions shown; findings below may reference images not displayed]

FINDINGS: Brain: No evidence of acute territorial infarction, hemorrhage,
hydrocephalus,extra-axial collection or mass lesion/mass effect.
Normal gray-white differentiation. Ventricles are normal in size and
contour.

Vascular: No hyperdense vessel or unexpected calcification.

Skull: The skull is intact. No fracture or focal lesion identified.

Sinuses/Orbits: The visualized paranasal sinuses and mastoid air
cells are clear. The orbits and globes intact.

Other: None

Cervical spine:

Alignment: There is slight straightening of the normal cervical
lordosis.

Skull base and vertebrae: Visualized skull base is intact. No
atlanto-occipital dissociation. The vertebral body heights are well
maintained. No fracture or pathologic osseous lesion seen.

Soft tissues and spinal canal: The visualized paraspinal soft
tissues are unremarkable. No prevertebral soft tissue swelling is
seen. The spinal canal is grossly unremarkable, no large epidural
collection or significant canal narrowing.

Disc levels: No no foraminal or canal stenosis.

Upper chest: The lung apices are clear. Thoracic inlet is within
normal limits.

Other: None
IMPRESSION: No acute intracranial abnormality.

No acute fracture or malalignment of the spine.

## 2021-03-02 ENCOUNTER — Ambulatory Visit
Admission: RE | Admit: 2021-03-02 | Discharge: 2021-03-02 | Disposition: A | Discharge status: Home / Self Care | Payer: 59 | Source: Ambulatory Visit | Attending: Emergency Medicine | Admitting: Emergency Medicine

## 2021-03-02 ENCOUNTER — Other Ambulatory Visit: Payer: Self-pay

## 2021-03-02 VITALS — BP 115/78 | HR 93 | Temp 97.9°F | Resp 18

## 2021-03-02 DIAGNOSIS — R319 Hematuria, unspecified: Secondary | ICD-10-CM | POA: Insufficient documentation

## 2021-03-02 DIAGNOSIS — N390 Urinary tract infection, site not specified: Secondary | ICD-10-CM | POA: Diagnosis present

## 2021-03-02 LAB — POCT URINALYSIS DIP (MANUAL ENTRY)
Glucose, UA: 100 mg/dL — AB
Nitrite, UA: POSITIVE — AB
Protein Ur, POC: >=300 mg/dL — AB
Spec Grav, UA: 1.025 (ref 1.010–1.025)
Urobilinogen, UA: 2 E.U./dL — AB
pH, UA: 5 (ref 5.0–8.0)

## 2021-03-02 LAB — POCT URINE PREGNANCY: Preg Test, Ur: NEGATIVE

## 2021-03-02 MED ORDER — FLUCONAZOLE 150 MG PO TABS: 150.0000 mg | ORAL_TABLET | Freq: Every day | ORAL | 0 refills | Status: AC

## 2021-03-02 MED ORDER — CEPHALEXIN 500 MG PO CAPS: 500.0000 mg | ORAL_CAPSULE | Freq: Two times a day (BID) | ORAL | 0 refills | Status: AC

## 2021-03-02 NOTE — ED Triage Notes (Addendum)
Patient c/o dysuria and LFT sided flank pain x 2 days.   Patient endorses "burning" with urination. Patient endorses foul smelling urine.   Patient endorses upon onset of symptoms " I had chills that night and I was drenched in sweat".   Patient endorses LFT sided flank that started last night.   Patient denies ABD pain and abnormal vaginal discharge.   Patient has taken AZO with relief of symptoms.

## 2021-03-02 NOTE — Discharge Instructions (Addendum)
Take the antibiotic as directed.  The urine culture is pending.  We will call you if it shows the need to change or discontinue your antibiotic.   ? ?Take the Diflucan as directed.   ? ?Follow up with your primary care provider if your symptoms are not improving.   ? ?

## 2021-03-02 NOTE — ED Provider Notes (Signed)
[facility]    CSN: [id number] Arrival date & time: 03/02/21  1457      History   Chief Complaint Chief Complaint  Patient presents with   Dysuria   APPT 1500   Flank Pain     HPI Amy Boyd is a 25 y.o. female.  Patient presents with 2-day history of dysuria, malodorous urine, left flank pain, chills.  She denies fever, abdominal pain, vaginal discharge, pelvic pain, or other symptoms.  Treatment attempted at home with Azo.  Her medical history includes chronic migraine headaches.  The history is provided by the patient and medical records.   Past Medical History:  Diagnosis Date   Migraine headache     Patient Active Problem List   Diagnosis Date Noted   Chronic migraine without aura, with intractable migraine, so stated, with status migrainosus 09/03/2018   Intractable migraine with aura without status migrainosus 09/03/2018   Complicated migraine 01/13/2018   Aphasia 01/11/2018    Past Surgical History:  Procedure Laterality Date   WISDOM TOOTH EXTRACTION     WRIST SURGERY Left     OB History   No obstetric history on file.      Home Medications    Prior to Admission medications   Medication Sig Start Date End Date Taking? Authorizing Provider  amphetamine-dextroamphetamine (ADDERALL XR) 10 MG 24 hr capsule  08/05/18  Yes [provider]  amphetamine-dextroamphetamine (ADDERALL XR) 15 MG 24 hr capsule  08/05/18  Yes [provider]  cephALEXin (KEFLEX) 500 MG capsule Take 1 capsule (500 mg total) by mouth 2 (two) times daily for 5 days. 03/02/21 03/07/21 Yes Mickie Bail, NP  citalopram (CELEXA) 40 MG tablet 40 mg. 07/31/18  Yes [provider]  fluconazole (DIFLUCAN) 150 MG tablet Take 1 tablet (150 mg total) by mouth daily. Take one tablet today.  May repeat in 3 days. 03/02/21  Yes Mickie Bail, NP  lamoTRIgine (LAMICTAL) 25 MG tablet Take 100 mg by mouth.   Yes [provider]  Erenumab-aooe  (AIMOVIG) 140 MG/ML SOAJ Inject 140 mg into the skin every 30 (thirty) days. 04/27/19   Anson Fret, MD  gabapentin (NEURONTIN) 300 MG capsule TAKE 1 CAPSULE BY MOUTH THREE TIMES A DAY Patient not taking: No sig reported 12/01/18   Anson Fret, MD  hydrOXYzine (ATARAX/VISTARIL) 25 MG tablet Take by mouth.    [provider]  levonorgestrel (KYLEENA) 19.5 MG IUD by Intrauterine route.    [provider]  ondansetron (ZOFRAN-ODT) 4 MG disintegrating tablet Take 1 tablet (4 mg total) by mouth every 8 (eight) hours as needed for nausea. Stop Reglan (metoclopramide) and the Compazine (prochloperazine). 11/09/18   Anson Fret, MD  rizatriptan (MAXALT-MLT) 10 MG disintegrating tablet Take 1 tablet (10 mg total) by mouth as needed for migraine. May repeat in 2 hours if needed 09/29/18   Anson Fret, MD    Family History Family History  Problem Relation Age of Onset   Migraines Mother     Social History Social History   Tobacco Use   Smoking status: Some Days   Smokeless tobacco: Never  Substance Use Topics   Alcohol use: Yes    Comment: Socially   Drug use: No     Allergies   Patient has no known allergies.   Review of Systems Review of Systems  Constitutional:  Positive for chills. Negative for fever.  Respiratory:  Negative for cough and shortness of breath.  Cardiovascular:  Negative for chest pain and palpitations.  Gastrointestinal:  Negative for abdominal pain and vomiting.  Genitourinary:  Positive for dysuria, flank pain and frequency. Negative for hematuria, pelvic pain and vaginal discharge.  Skin:  Negative for color change and rash.  All other systems reviewed and are negative.   Physical Exam Triage Vital Signs ED Triage Vitals  Enc Vitals Group     BP      Pulse      Resp      Temp      Temp src      SpO2      Weight      Height      Head Circumference      Peak Flow      Pain Score      Pain Loc      Pain Edu?       Excl. in GC?    No data found.  Updated Vital Signs BP 115/78 (BP Location: Left Arm)   Pulse 93   Temp 97.9 F (36.6 C) (Oral)   Resp 18   LMP  (LMP Unknown)   SpO2 96%   Visual Acuity Right Eye Distance:   Left Eye Distance:   Bilateral Distance:    Right Eye Near:   Left Eye Near:    Bilateral Near:     Physical Exam Vitals and nursing note reviewed.  Constitutional:      General: She is not in acute distress.    Appearance: She is well-developed. She is not ill-appearing.  HENT:     Head: Normocephalic and atraumatic.     Mouth/Throat:     Mouth: Mucous membranes are moist.  Eyes:     Conjunctiva/sclera: Conjunctivae normal.  Cardiovascular:     Rate and Rhythm: Normal rate and regular rhythm.     Heart sounds: Normal heart sounds.  Pulmonary:     Effort: Pulmonary effort is normal. No respiratory distress.     Breath sounds: Normal breath sounds.  Abdominal:     General: Bowel sounds are normal.     Palpations: Abdomen is soft.     Tenderness: There is no abdominal tenderness. There is no right CVA tenderness, left CVA tenderness, guarding or rebound.  Musculoskeletal:     Cervical back: Neck supple.  Skin:    General: Skin is warm and dry.  Neurological:     General: No focal deficit present.     Mental Status: She is alert and oriented to person, place, and time.     Gait: Gait normal.  Psychiatric:        Mood and Affect: Mood normal.        Behavior: Behavior normal.     UC Treatments / Results  Labs (all labs ordered are listed, but only abnormal results are displayed) Labs Reviewed  POCT URINALYSIS DIP (MANUAL ENTRY) - Abnormal; Notable for the following components:      Result Value   Color, UA orange (*)    Clarity, UA cloudy (*)    Glucose, UA =100 (*)    Bilirubin, UA small (*)    Ketones, POC UA trace (5) (*)    Blood, UA large (*)    Protein Ur, POC >=300 (*)    Urobilinogen, UA 2.0 (*)    Nitrite, UA Positive (*)    Leukocytes,  UA Large (3+) (*)    All other components within normal limits  URINE CULTURE  POCT URINE PREGNANCY  EKG   Radiology No results found.  Procedures Procedures (including critical care time)  Medications Ordered in UC Medications - No data to display  Initial Impression / Assessment and Plan / UC Course  I have reviewed the triage vital signs and the nursing notes.  Pertinent labs & imaging results that were available during my care of the patient were reviewed by me and considered in my medical decision making (see chart for details).   UTI.  Treating with Keflex. Urine culture pending. Discussed with patient that we will call her if the urine culture shows the need to change or discontinue the antibiotic.  Also prescribe 1 tablet of Diflucan as patient reports she usually gets vaginal yeast infection from antibiotics.  Instructed her to follow-up with her PCP if her symptoms are not improving. Patient agrees to plan of care.      Final Clinical Impressions(s) / UC Diagnoses   Final diagnoses:  Urinary tract infection with hematuria, site unspecified     Discharge Instructions      Take the antibiotic as directed.  The urine culture is pending.  We will call you if it shows the need to change or discontinue your antibiotic.    Take the Diflucan as directed.   Follow up with your primary care provider if your symptoms are not improving.         ED Prescriptions     Medication Sig Dispense Auth. Provider   cephALEXin (KEFLEX) 500 MG capsule Take 1 capsule (500 mg total) by mouth 2 (two) times daily for 5 days. 10 capsule Mickie Bail, NP   fluconazole (DIFLUCAN) 150 MG tablet Take 1 tablet (150 mg total) by mouth daily. Take one tablet today.  May repeat in 3 days. 1 tablet Mickie Bail, NP      PDMP not reviewed this encounter.   Mickie Bail, NP 03/02/21 [phone]

## 2021-03-04 LAB — URINE CULTURE

## 2021-04-27 ENCOUNTER — Emergency Department: Payer: 59

## 2021-04-27 ENCOUNTER — Emergency Department
Admission: EM | Admit: 2021-04-27 | Discharge: 2021-04-27 | Disposition: A | Discharge status: Home / Self Care | Payer: 59 | Attending: Emergency Medicine | Admitting: Emergency Medicine

## 2021-04-27 ENCOUNTER — Other Ambulatory Visit: Payer: Self-pay

## 2021-04-27 DIAGNOSIS — F17200 Nicotine dependence, unspecified, uncomplicated: Secondary | ICD-10-CM | POA: Insufficient documentation

## 2021-04-27 DIAGNOSIS — W25XXXA Contact with sharp glass, initial encounter: Secondary | ICD-10-CM | POA: Diagnosis not present

## 2021-04-27 DIAGNOSIS — S91312A Laceration without foreign body, left foot, initial encounter: Secondary | ICD-10-CM | POA: Insufficient documentation

## 2021-04-27 DIAGNOSIS — Z23 Encounter for immunization: Secondary | ICD-10-CM | POA: Insufficient documentation

## 2021-04-27 DIAGNOSIS — Y92019 Unspecified place in single-family (private) house as the place of occurrence of the external cause: Secondary | ICD-10-CM | POA: Diagnosis not present

## 2021-04-27 MED ORDER — BACITRACIN ZINC 500 UNIT/GM EX OINT
TOPICAL_OINTMENT | Freq: Once | CUTANEOUS | Status: AC
  Filled 2021-04-27: qty 0.9

## 2021-04-27 MED ORDER — TETANUS-DIPHTH-ACELL PERTUSSIS 5-2.5-18.5 LF-MCG/0.5 IM SUSY
0.5000 mL | PREFILLED_SYRINGE | Freq: Once | INTRAMUSCULAR | Status: AC
  Administered 2021-04-27: 0.5 mL via INTRAMUSCULAR
  Filled 2021-04-27: qty 0.5

## 2021-04-27 MED ORDER — CEPHALEXIN 500 MG PO CAPS
500.0000 mg | ORAL_CAPSULE | Freq: Once | ORAL | Status: AC
  Administered 2021-04-27: 500 mg via ORAL
  Filled 2021-04-27: qty 1

## 2021-04-27 MED ORDER — CEPHALEXIN 500 MG PO CAPS: 500.0000 mg | ORAL_CAPSULE | Freq: Two times a day (BID) | ORAL | 0 refills | Status: AC

## 2021-04-27 NOTE — ED Notes (Signed)
Patient's foot soaking at this time.

## 2021-04-27 NOTE — ED Triage Notes (Addendum)
Pt arrived via POV with reports of stepping on piece of glass yesterday at home, pt states she continues to have pain and states there is lac between the 3rd and 4th toe on L foot.  Pt has bandage in place at this time.  Unknown when last tetanus shot was.

## 2021-04-27 NOTE — ED Notes (Signed)
Xeroform, gauze and buddy tape applied to left foot.

## 2021-04-27 NOTE — Discharge Instructions (Addendum)
You may alternate Tylenol 1000 mg every 6 hours as needed for pain, fever and Ibuprofen 800 mg every 8 hours as needed for pain, fever.  Please take Ibuprofen with food.  Do not take more than 4000 mg of Tylenol (acetaminophen) in a 24 hour period.   Steps to find a Primary Care Provider (PCP):  Call [phone] or [phone] to access "[facility] [facility] Find a Doctor Service."  2.  You may also go on the [facility] website at [email or web address]  I recommend cleaning your foot gently with warm soap and water once daily.  You may apply Neosporin in between these toes once daily.  I recommend keeping your foot dry and covered and try to stay off it as much as possible for the next week.  Unfortunate this time we are unable to suture this wound given this occurred several days ago.  Please take your antibiotics until complete given your high risk for infection.  Please return the emergency department if you have fever of 100.4 or higher, increasing pain, redness or warmth, drainage of pus from this wound.

## 2021-04-27 NOTE — ED Provider Notes (Signed)
[facility] Emergency Department Provider Note  ____________________________________________   Event Date/Time   First MD Initiated Contact with Patient 04/27/21 [phone]     (approximate)  I have reviewed the triage vital signs and the nursing notes.   HISTORY  Chief Complaint Foot Injury    HPI Amy Boyd is a 25 y.o. female with history of migraines who presents to the emergency department with a laceration between the left third and fourth toes that occurred on Monday, September 5.  States that she stepped on a piece of glass from a broken wine glass at home.  States she went to urgent care but left due to long wait times.  She is unsure of her last tetanus vaccination.  States she went to work tonight and it was painful to ambulate so she decided to come to the doctor.  No fever, redness, warmth or drainage.  Has been bleeding intermittently.        Past Medical History:  Diagnosis Date   Migraine headache     Patient Active Problem List   Diagnosis Date Noted   Chronic migraine without aura, with intractable migraine, so stated, with status migrainosus 09/03/2018   Intractable migraine with aura without status migrainosus 09/03/2018   Complicated migraine 01/13/2018   Aphasia 01/11/2018    Past Surgical History:  Procedure Laterality Date   WISDOM TOOTH EXTRACTION     WRIST SURGERY Left     Prior to Admission medications   Medication Sig Start Date End Date Taking? Authorizing Provider  cephALEXin (KEFLEX) 500 MG capsule Take 1 capsule (500 mg total) by mouth 2 (two) times daily. 04/27/21  Yes Amelda Hapke, Layla Maw, DO  amphetamine-dextroamphetamine (ADDERALL XR) 10 MG 24 hr capsule  08/05/18   [provider]  amphetamine-dextroamphetamine (ADDERALL XR) 15 MG 24 hr capsule  08/05/18   [provider]  citalopram (CELEXA) 40 MG tablet 40 mg. 07/31/18   [provider]  Erenumab-aooe (AIMOVIG) 140 MG/ML SOAJ Inject  140 mg into the skin every 30 (thirty) days. 04/27/19   Anson Fret, MD  fluconazole (DIFLUCAN) 150 MG tablet Take 1 tablet (150 mg total) by mouth daily. Take one tablet today.  May repeat in 3 days. 03/02/21   Mickie Bail, NP  gabapentin (NEURONTIN) 300 MG capsule TAKE 1 CAPSULE BY MOUTH THREE TIMES A DAY Patient not taking: No sig reported 12/01/18   Anson Fret, MD  hydrOXYzine (ATARAX/VISTARIL) 25 MG tablet Take by mouth.    [provider]  lamoTRIgine (LAMICTAL) 25 MG tablet Take 100 mg by mouth.    [provider]  levonorgestrel (KYLEENA) 19.5 MG IUD by Intrauterine route.    [provider]  ondansetron (ZOFRAN-ODT) 4 MG disintegrating tablet Take 1 tablet (4 mg total) by mouth every 8 (eight) hours as needed for nausea. Stop Reglan (metoclopramide) and the Compazine (prochloperazine). 11/09/18   Anson Fret, MD  rizatriptan (MAXALT-MLT) 10 MG disintegrating tablet Take 1 tablet (10 mg total) by mouth as needed for migraine. May repeat in 2 hours if needed 09/29/18   Anson Fret, MD    Allergies Patient has no known allergies.  Family History  Problem Relation Age of Onset   Migraines Mother     Social History Social History   Tobacco Use   Smoking status: Some Days   Smokeless tobacco: Never  Substance Use Topics   Alcohol use: Yes    Comment: Socially   Drug  use: No    Review of Systems Constitutional: No fever. Eyes: No visual changes. ENT: No sore throat. Cardiovascular: Denies chest pain. Respiratory: Denies shortness of breath. Gastrointestinal: No nausea, vomiting, diarrhea. Genitourinary: Negative for dysuria. Musculoskeletal: Negative for back pain. Skin: Negative for rash. Neurological: Negative for focal weakness or numbness.  ____________________________________________   PHYSICAL EXAM:  VITAL SIGNS: ED Triage Vitals  Enc Vitals Group     BP 04/27/21 0029 131/84     Pulse Rate 04/27/21 0029 90      Resp 04/27/21 0029 18     Temp 04/27/21 0029 98.6 F (37 C)     Temp src --      SpO2 04/27/21 0029 96 %     Weight 04/27/21 0030 155 lb (70.3 kg)     Height 04/27/21 0030 5\' 4"  (1.626 m)     Head Circumference --      Peak Flow --      Pain Score 04/27/21 0030 7     Pain Loc --      Pain Edu? --      Excl. in GC? --    CONSTITUTIONAL: Alert and responds appropriately to questions. Well-appearing; well-nourished HEAD: Normocephalic, atraumatic EYES: Conjunctivae clear, pupils appear equal ENT: normal nose; moist mucous membranes NECK: Normal range of motion CARD: Regular rate and rhythm RESP: Normal chest excursion without splinting or tachypnea; no hypoxia or respiratory distress, speaking full sentences ABD/GI: non-distended EXT: Normal ROM in all joints, no major deformities noted; 2+ left DP pulse, patient has a 3 cm laceration between her third and fourth toe with a large skin flap over this area this still appears to be vascularized and attached.  There is also another small 1 cm laceration just proximal to the left fourth toe on the sole of the foot.  There is no sign of retained foreign body, redness, warmth, purulent drainage.  There is a small amount of bleeding.  She has normal range of motion in all of her toes.  There is no sign of any tendon involvement.  Normal capillary refill. SKIN: Normal color for age and race, no rashes on exposed skin NEURO: Moves all extremities equally, normal speech, no facial asymmetry noted PSYCH: The patient's mood and manner are appropriate. Grooming and personal hygiene are appropriate.  ____________________________________________   LABS (all labs ordered are listed, but only abnormal results are displayed)  Labs Reviewed - No data to display ____________________________________________  EKG   ____________________________________________  RADIOLOGY I, Korin Hartwell, personally viewed and evaluated these images (plain radiographs)  as part of my medical decision making, as well as reviewing the written report by the radiologist.  ED MD interpretation: X-ray of the left foot shows no fracture or radiopaque foreign body.  Official radiology report(s): DG Foot Complete Left  Result Date: 04/27/2021 CLINICAL DATA:  Pain, stepped on glass EXAM: LEFT FOOT - COMPLETE 3+ VIEW COMPARISON:  None. FINDINGS: There is no evidence of fracture or dislocation. There is no evidence of arthropathy or other focal bone abnormality. Soft tissues are unremarkable. No radiopaque foreign body. IMPRESSION: Negative. Electronically Signed   By: 06/27/2021 M.D.   On: 04/27/2021 02:44    ____________________________________________   PROCEDURES  Procedure(s) performed (including Critical Care):  Procedures    ____________________________________________   INITIAL IMPRESSION / ASSESSMENT AND PLAN / ED COURSE  As part of my medical decision making, I reviewed the following data within the electronic MEDICAL RECORD NUMBER Nursing notes reviewed and incorporated,  Old chart reviewed, Radiograph reviewed , and Notes from prior ED visits         Patient here with a laceration between the third and fourth toes of the left foot that occurred 4 days ago.  She has no signs of superimposed infection currently but discussed with patient that this will now need to heal by secondary intention given it has been so long since the injury and she would be very high risk for infection at this point with closing the wound.  I will put her on prophylactic antibiotics.  We will update her tetanus vaccination.  We have soaked her foot in saline and Betadine and applied bacitracin, Xeroform and a sterile dressing and buddy taped her third and fourth toes together.  Provided her with a postop shoe.  Discussed wound care instructions, return precautions.  Recommended over-the-counter Tylenol and Motrin for pain control.  She declines any narcotic pain medication.   Declines any pain medication here in the ED.  X-ray obtained in triage shows no fracture or retained foreign body.  At this time, I do not feel there is any life-threatening condition present. I have reviewed, interpreted and discussed all results (EKG, imaging, lab, urine as appropriate) and exam findings with patient/family. I have reviewed nursing notes and appropriate previous records.  I feel the patient is safe to be discharged home without further emergent workup and can continue workup as an outpatient as needed. Discussed usual and customary return precautions. Patient/family verbalize understanding and are comfortable with this plan.  Outpatient follow-up has been provided as needed. All questions have been answered.  ____________________________________________   FINAL CLINICAL IMPRESSION(S) / ED DIAGNOSES  Final diagnoses:  Foot laceration, left, initial encounter     ED Discharge Orders          Ordered    cephALEXin (KEFLEX) 500 MG capsule  2 times daily        04/27/21 0316            *Please note:  KANYON BUNN was evaluated in Emergency Department on 04/27/2021 for the symptoms described in the history of present illness. She was evaluated in the context of the global COVID-19 pandemic, which necessitated consideration that the patient might be at risk for infection with the SARS-CoV-2 virus that causes COVID-19. Institutional protocols and algorithms that pertain to the evaluation of patients at risk for COVID-19 are in a state of rapid change based on information released by regulatory bodies including the CDC and federal and state organizations. These policies and algorithms were followed during the patient's care in the ED.  Some ED evaluations and interventions may be delayed as a result of limited staffing during and the pandemic.*   Note:  This document was prepared using Dragon voice recognition software and may include unintentional dictation errors.     Neeti Knudtson, Layla Maw, DO 04/27/21 [phone]

## 2021-11-27 ENCOUNTER — Emergency Department
Admission: EM | Admit: 2021-11-27 | Discharge: 2021-11-27 | Disposition: A | Discharge status: Home / Self Care | Payer: Self-pay | Attending: Emergency Medicine | Admitting: Emergency Medicine

## 2021-11-27 ENCOUNTER — Other Ambulatory Visit: Payer: Self-pay

## 2021-11-27 ENCOUNTER — Emergency Department: Payer: Self-pay

## 2021-11-27 ENCOUNTER — Encounter: Payer: Self-pay | Admitting: Intensive Care

## 2021-11-27 DIAGNOSIS — F10920 Alcohol use, unspecified with intoxication, uncomplicated: Secondary | ICD-10-CM | POA: Insufficient documentation

## 2021-11-27 DIAGNOSIS — R0789 Other chest pain: Secondary | ICD-10-CM | POA: Insufficient documentation

## 2021-11-27 DIAGNOSIS — R112 Nausea with vomiting, unspecified: Secondary | ICD-10-CM | POA: Insufficient documentation

## 2021-11-27 DIAGNOSIS — R110 Nausea: Secondary | ICD-10-CM

## 2021-11-27 HISTORY — DX: Anxiety disorder, unspecified: F41.9

## 2021-11-27 HISTORY — DX: Depression, unspecified: F32.A

## 2021-11-27 HISTORY — DX: Bipolar disorder, unspecified: F31.9

## 2021-11-27 LAB — BASIC METABOLIC PANEL
Anion gap: 11 (ref 5–15)
BUN: 11 mg/dL (ref 6–20)
CO2: 24 mmol/L (ref 22–32)
Calcium: 9.4 mg/dL (ref 8.9–10.3)
Chloride: 99 mmol/L (ref 98–111)
Creatinine, Ser: 0.87 mg/dL (ref 0.44–1.00)
GFR, Estimated: >60 mL/min (ref >60)
Glucose, Bld: 177 mg/dL — ABNORMAL HIGH (ref 70–99)
Potassium: 3.5 mmol/L (ref 3.5–5.1)
Sodium: 134 mmol/L — ABNORMAL LOW (ref 135–145)

## 2021-11-27 LAB — CBC
HCT: 41.9 % (ref 36.0–46.0)
Hemoglobin: 14.3 g/dL (ref 12.0–15.0)
MCH: 31.2 pg (ref 26.0–34.0)
MCHC: 34.1 g/dL (ref 30.0–36.0)
MCV: 91.3 fL (ref 80.0–100.0)
Platelets: 386 10*3/uL (ref 150–400)
RBC: 4.59 MIL/uL (ref 3.87–5.11)
RDW: 11 % — ABNORMAL LOW (ref 11.5–15.5)
WBC: 7.6 10*3/uL (ref 4.0–10.5)
nRBC: 0 % (ref 0.0–0.2)

## 2021-11-27 LAB — TROPONIN I (HIGH SENSITIVITY): Troponin I (High Sensitivity): <2 ng/L (ref <18)

## 2021-11-27 MED ORDER — PROMETHAZINE HCL 12.5 MG PO TABS: 12.5000 mg | ORAL_TABLET | Freq: Four times a day (QID) | ORAL | 0 refills | Status: AC | PRN

## 2021-11-27 MED ORDER — ONDANSETRON 4 MG PO TBDP
ORAL_TABLET | ORAL | Status: AC
  Filled 2021-11-27: qty 1

## 2021-11-27 MED ORDER — ONDANSETRON 4 MG PO TBDP
4.0000 mg | ORAL_TABLET | Freq: Once | ORAL | Status: AC | PRN
  Administered 2021-11-27: 4 mg via ORAL

## 2021-11-27 NOTE — Discharge Instructions (Addendum)
-  You may take promethazine as needed for nausea ?-Please hydrate frequentlyw/ water/gatorade for the next 24/48 hours.  ?-Return to the emergency department at any time if you begin to experience any new or worsening symptoms ?

## 2021-11-27 NOTE — ED Provider Notes (Signed)
? ?[facility] ?Provider Note ? ? ? Event Date/Time  ? First MD Initiated Contact with Patient 11/27/21 1526   ?  (approximate) ? ? ?History  ? ?Chief Complaint ?Nausea and Chest Pain ? ? ?HPI ?Amy Boyd is a 26 y.o. female, history of migraine, bipolar 1, anxiety/depression, presents to the emergency department for evaluation of chest pain/nausea.  Patient states that she was drinking alcohol all day yesterday and woke up with intense nausea/vomiting this morning.  She states that she felt her heart began to race shortly after the vomiting.  She states that she does not currently feel any chest pain at this time, though does have persistent nausea.  Denies fever/chills, shortness of breath, abdominal pain, flank pain, diarrhea, urinary symptoms, or dizziness/lightheadedness.  Denies any concomitant drug use. ? ?History Limitations: No limitations. ? ?    ? ? ?Physical Exam  ?Triage Vital Signs: ?ED Triage Vitals  ?Enc Vitals Group  ?   BP 11/27/21 1307 (!) 154/100  ?   Pulse Rate 11/27/21 1307 (!) 125  ?   Resp 11/27/21 1307 16  ?   Temp 11/27/21 1307 97.9 ?F (36.6 ?C)  ?   Temp Source 11/27/21 1307 Oral  ?   SpO2 11/27/21 1307 99 %  ?   Weight 11/27/21 1311 140 lb (63.5 kg)  ?   Height 11/27/21 1311 5\' 4"  (1.626 m)  ?   Head Circumference --   ?   Peak Flow --   ?   Pain Score 11/27/21 1311 7  ?   Pain Loc --   ?   Pain Edu? --   ?   Excl. in GC? --   ? ? ?Most recent vital signs: ?Vitals:  ? 11/27/21 1307 11/27/21 1537  ?BP: (!) 154/100 (!) 148/98  ?Pulse: (!) 125 (!) 110  ?Resp: 16 16  ?Temp: 97.9 ?F (36.6 ?C)   ?SpO2: 99% 99%  ? ? ?General: Awake, NAD.  Appears uncomfortable. ?Skin: Warm, dry. No rashes or lesions.  ?Eyes: PERRL. Conjunctivae normal.  ?Neck: Normal ROM. No nuchal rigidity.  ?CV: Good peripheral perfusion.  ?Resp: Normal effort.  ?Abd: Soft, non-tender. No distention.  ?Neuro: At baseline. No gross neurological deficits.  ?MSK: No gross deformities. Normal ROM in all  extremities.  ? ? ?Physical Exam ? ? ? ?ED Results / Procedures / Treatments  ?Labs ?(all labs ordered are listed, but only abnormal results are displayed) ?Labs Reviewed  ?BASIC METABOLIC PANEL - Abnormal; Notable for the following components:  ?    Result Value  ? Sodium 134 (*)   ? Glucose, Bld 177 (*)   ? All other components within normal limits  ?CBC - Abnormal; Notable for the following components:  ? RDW 11.0 (*)   ? All other components within normal limits  ?TROPONIN I (HIGH SENSITIVITY)  ? ? ? ?EKG ?Sinus tachycardia, rate of 105, no T-segment changes, no axis deviations, no AV blocks, normal QRS interval. ? ? ?RADIOLOGY ? ?ED Provider Interpretation: I personally reviewed this x-ray, no evidence of acute cardiopulmonary disease based on my interpretation. ? ?DG Chest 2 View ? ?Result Date: 11/27/2021 ?CLINICAL DATA:  Chest pain. EXAM: CHEST - 2 VIEW COMPARISON:  03/30/2015 FINDINGS: No consolidation. No visible pleural effusions or pneumothorax. Cardiomediastinal silhouette is within normal limits. No acute osseous abnormality. IMPRESSION: No evidence of acute cardiopulmonary disease. Electronically Signed   By: 05/30/2015 M.D.   On: 11/27/2021 13:48   ? ?  PROCEDURES: ? ?Critical Care performed: None. ? ?Procedures ? ? ? ?MEDICATIONS ORDERED IN ED: ?Medications  ?ondansetron (ZOFRAN-ODT) disintegrating tablet 4 mg (4 mg Oral Given 11/27/21 1318)  ? ? ? ?IMPRESSION / MDM / ASSESSMENT AND PLAN / ED COURSE  ?I reviewed the triage vital signs and the nursing notes. ?             ?               ? ?Differential diagnosis includes, but is not limited to, ACS, gastroenteritis, alcohol intoxication, Boerhaave's, gastritis. ? ?ED Course ?Patient appears well.  Vitals are within normal limits.  We will go ahead treat with ondansetron 4 mg. ? ?CBC shows no leukocytosis or anemia. ? ?BMP shows no remarkable electrolyte abnormalities or kidney injury. ? ?EKG unremarkable.  Initial troponin less than 2.  Unlikely  ACS ? ?Chest x-ray shows no acute cardiopulmonary abnormalities. ? ?Assessment/Plan ?Presentation consistent with post alcohol intoxication emesis.  I suspect her chest tightness and palpitations likely related to discomfort from excessive vomiting.  Lab work-up reassuring.  No evidence of ACS.  Low suspicion for pulmonary embolism given her history.  Patient states that she feels better after the ondansetron.  We will plan to discharge this patient with a prescription for promethazine (per her request) to be used as needed for nausea/vomiting. ? ?Considered admission for this patient, but given the patient's improvement in symptoms, stable vitals, and unremarkable work-up, she is unlikely to benefit. ? ?Provided the patient with anticipatory guidance, return precautions, and educational material. Encouraged the patient to return to the emergency department at any time if they begin to experience any new or worsening symptoms. Patient expressed understanding and agreed with the plan.  ? ?  ? ? ?FINAL CLINICAL IMPRESSION(S) / ED DIAGNOSES  ? ?Final diagnoses:  ?Nausea  ? ? ? ?Rx / DC Orders  ? ?ED Discharge Orders   ? ?      Ordered  ?  promethazine (PHENERGAN) 12.5 MG tablet  Every 6 hours PRN       ? 11/27/21 1530  ? ?  ?  ? ?  ? ? ? ?Note:  This document was prepared using Dragon voice recognition software and may include unintentional dictation errors. ?  ?Varney Daily, [place] ?11/27/21 2347 ? ?  ?Jene Every, MD ?12/03/21 [phone] ? ?

## 2021-11-27 NOTE — ED Notes (Signed)
Pt requested vital check due to feeling more shob. HR 98, o2 100, BP 142/67 ?

## 2021-11-27 NOTE — ED Triage Notes (Signed)
Patient c/o chest tightness and nausea. Reports drinking all day yesterday and woke up feeling her heart race and nauseas.  ?

## 2022-09-20 IMAGING — CR DG CHEST 2V
2 series · 2 of 2 positions shown · non-contrast
Comparison: 03/30/2015

CLINICAL DATA: Chest pain.

EXAM:
CHEST - 2 VIEW

[chest pa]
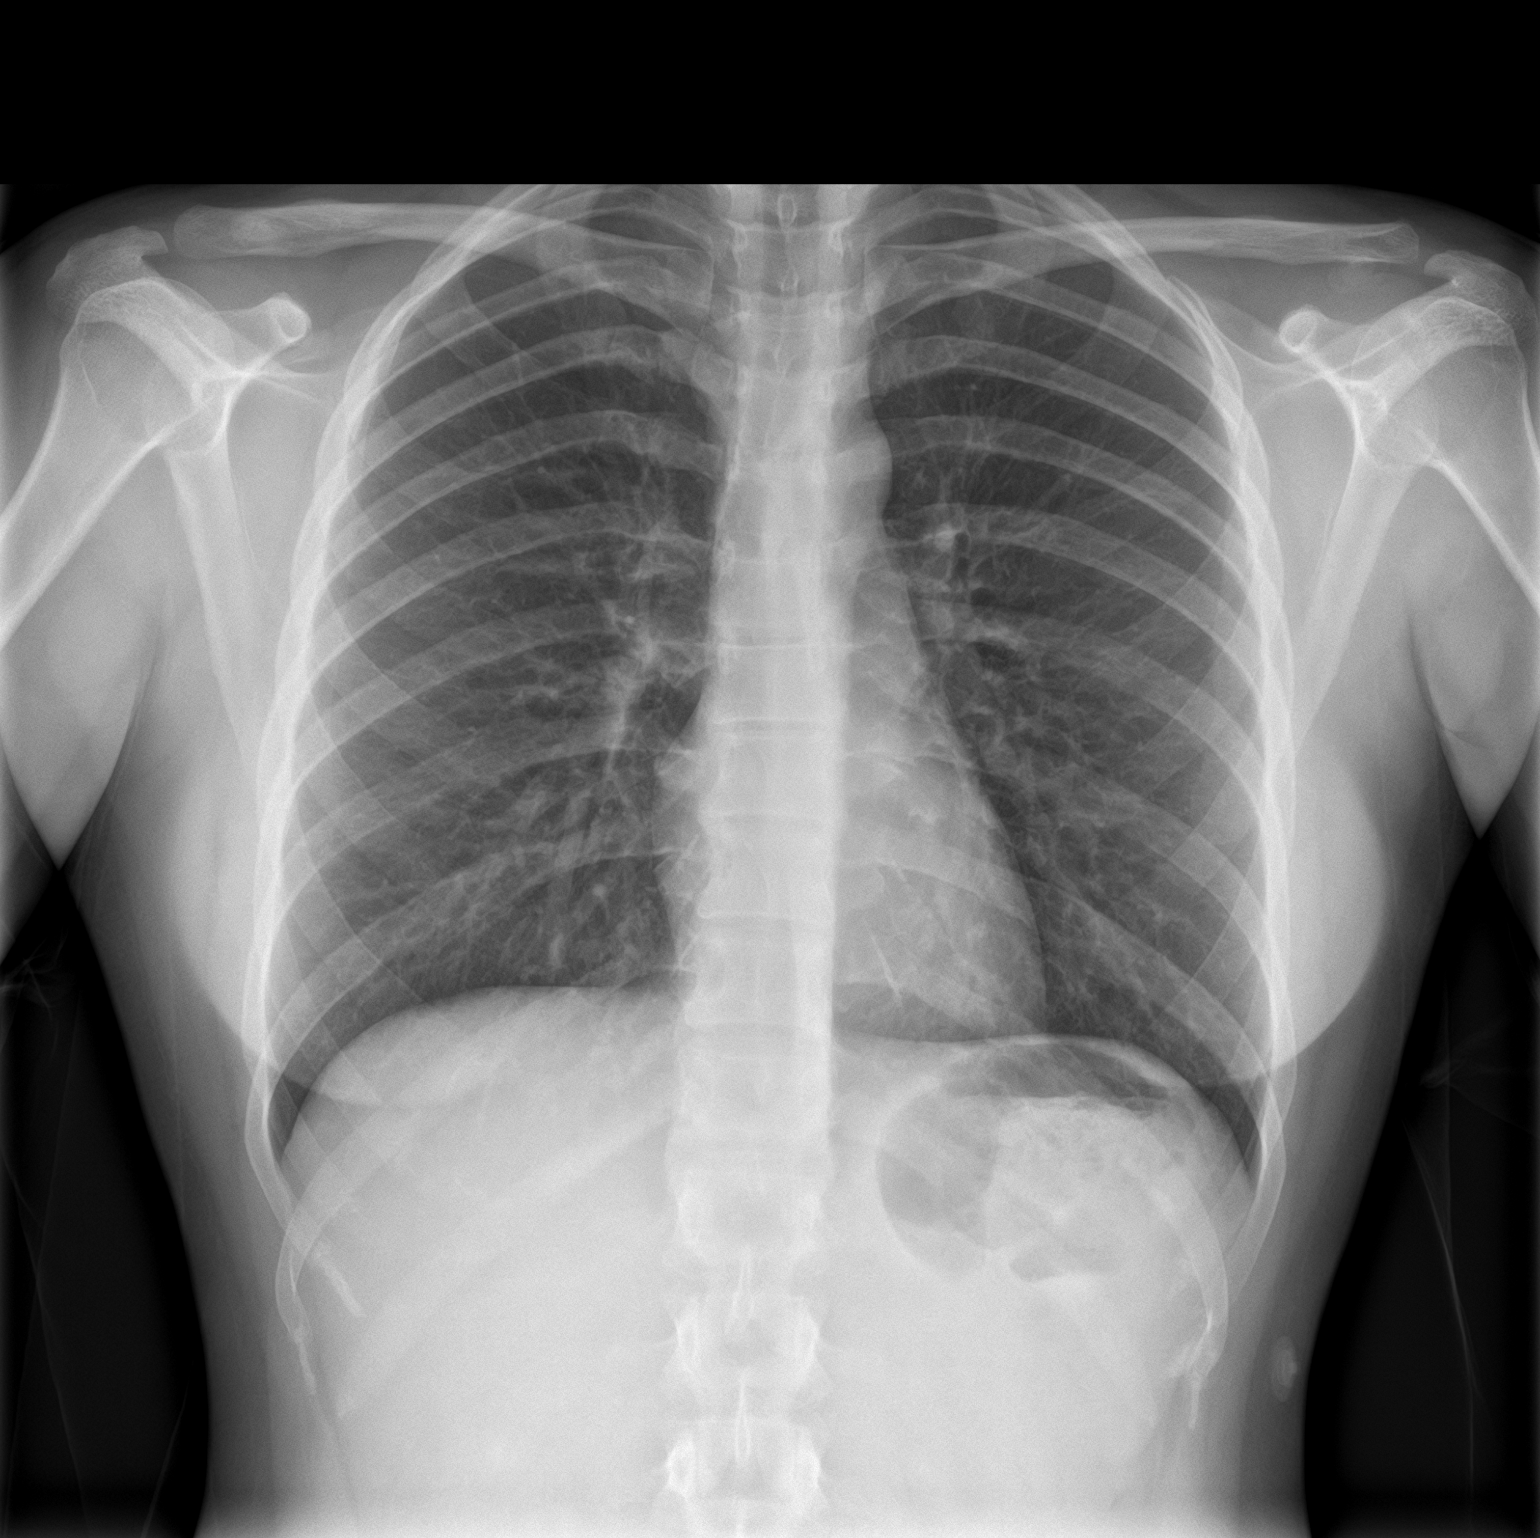

[chest lat]
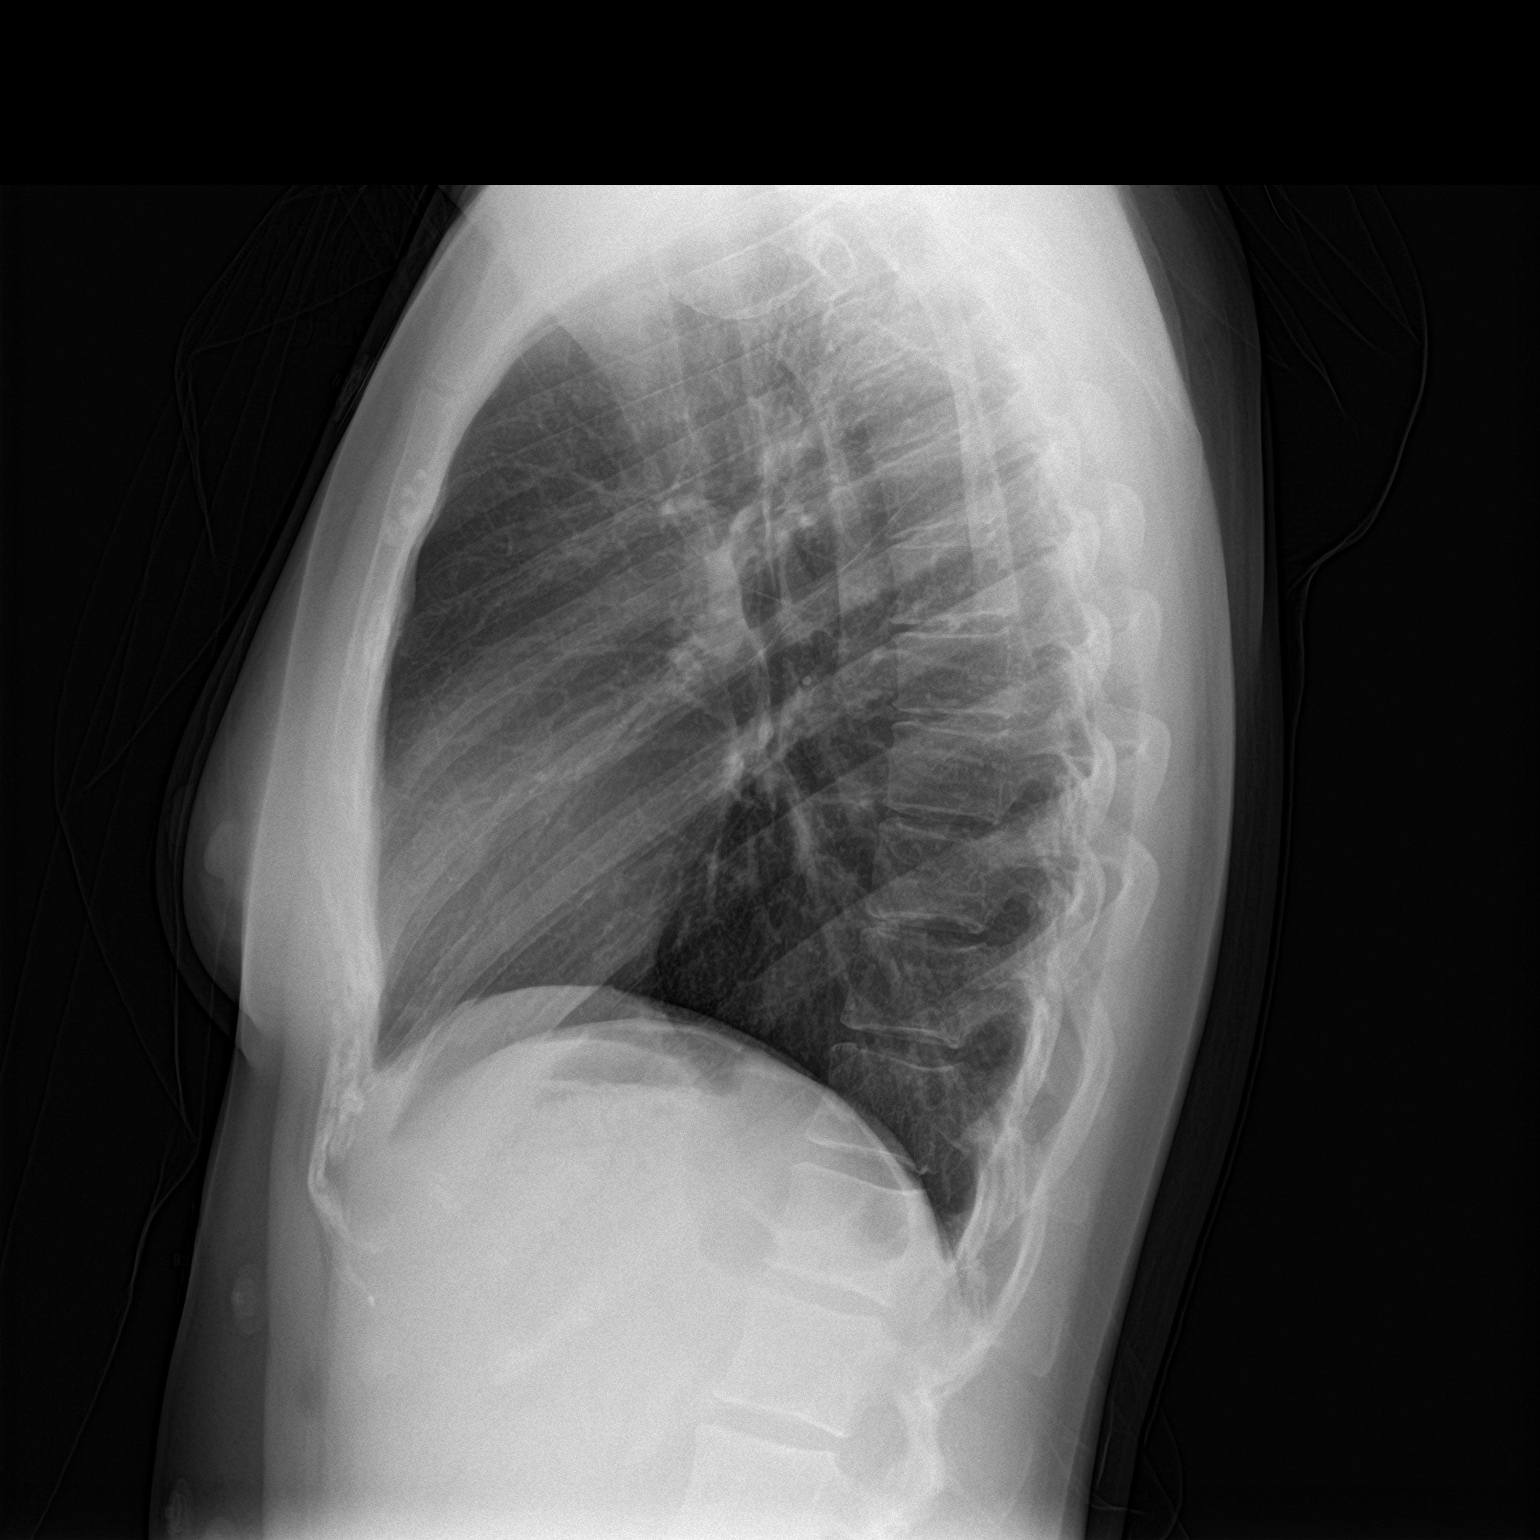

[2 of 2 positions shown; findings below may reference images not displayed]

FINDINGS: No consolidation. No visible pleural effusions or pneumothorax.
Cardiomediastinal silhouette is within normal limits. No acute
osseous abnormality.
IMPRESSION: No evidence of acute cardiopulmonary disease.
